# Patient Record
Sex: Male | Born: 1945 | Race: White | Hispanic: No | Marital: Married | Smoking: Former smoker
Health system: Southern US, Community
[De-identification: ages and names within clinical notes are randomized; demographics above are authoritative.]

## PROBLEM LIST (undated history)

## (undated) DIAGNOSIS — M1990 Unspecified osteoarthritis, unspecified site: Secondary | ICD-10-CM

## (undated) DIAGNOSIS — I10 Essential (primary) hypertension: Secondary | ICD-10-CM

## (undated) DIAGNOSIS — E119 Type 2 diabetes mellitus without complications: Secondary | ICD-10-CM

## (undated) HISTORY — PX: JOINT REPLACEMENT: SHX530

## (undated) HISTORY — PX: SMALL INTESTINE SURGERY: SHX150

## (undated) HISTORY — PX: CHOLECYSTECTOMY: SHX55

---

## 2008-12-28 DIAGNOSIS — E119 Type 2 diabetes mellitus without complications: Secondary | ICD-10-CM | POA: Insufficient documentation

## 2009-01-12 DIAGNOSIS — I10 Essential (primary) hypertension: Secondary | ICD-10-CM | POA: Insufficient documentation

## 2011-02-08 DIAGNOSIS — R591 Generalized enlarged lymph nodes: Secondary | ICD-10-CM | POA: Insufficient documentation

## 2013-06-20 DIAGNOSIS — R809 Proteinuria, unspecified: Secondary | ICD-10-CM | POA: Insufficient documentation

## 2014-06-23 DIAGNOSIS — Z2821 Immunization not carried out because of patient refusal: Secondary | ICD-10-CM | POA: Insufficient documentation

## 2014-06-23 DIAGNOSIS — Z789 Other specified health status: Secondary | ICD-10-CM | POA: Insufficient documentation

## 2015-08-27 ENCOUNTER — Encounter: Payer: Self-pay | Admitting: Sports Medicine

## 2015-08-27 ENCOUNTER — Ambulatory Visit (INDEPENDENT_AMBULATORY_CARE_PROVIDER_SITE_OTHER): Payer: Medicare Other | Admitting: Sports Medicine

## 2015-08-27 DIAGNOSIS — M79672 Pain in left foot: Secondary | ICD-10-CM | POA: Diagnosis not present

## 2015-08-27 DIAGNOSIS — B351 Tinea unguium: Secondary | ICD-10-CM

## 2015-08-27 DIAGNOSIS — M216X9 Other acquired deformities of unspecified foot: Secondary | ICD-10-CM

## 2015-08-27 DIAGNOSIS — M79671 Pain in right foot: Secondary | ICD-10-CM

## 2015-08-27 DIAGNOSIS — E1142 Type 2 diabetes mellitus with diabetic polyneuropathy: Secondary | ICD-10-CM | POA: Diagnosis not present

## 2015-08-27 DIAGNOSIS — L84 Corns and callosities: Secondary | ICD-10-CM | POA: Diagnosis not present

## 2015-08-27 NOTE — Patient Instructions (Addendum)
Diabetes and Foot Care Diabetes may cause you to have problems because of poor blood supply (circulation) to your feet and legs. This may cause the skin on your feet to become thinner, break easier, and heal more slowly. Your skin may become dry, and the skin may peel and crack. You may also have nerve damage in your legs and feet causing decreased feeling in them. You may not notice minor injuries to your feet that could lead to infections or more serious problems. Taking care of your feet is one of the most important things you can do for yourself.  HOME CARE INSTRUCTIONS  Wear shoes at all times, even in the house. Do not go barefoot. Bare feet are easily injured.  Check your feet daily for blisters, cuts, and redness. If you cannot see the bottom of your feet, use a mirror or ask someone for help.  Wash your feet with warm water (do not use hot water) and mild soap. Then pat your feet and the areas between your toes until they are completely dry. Do not soak your feet as this can dry your skin.  Apply a moisturizing lotion or petroleum jelly (that does not contain alcohol and is unscented) to the skin on your feet and to dry, brittle toenails. Do not apply lotion between your toes.  Trim your toenails straight across. Do not dig under them or around the cuticle. File the edges of your nails with an emery board or nail file.  Do not cut corns or calluses or try to remove them with medicine.  Wear clean socks or stockings every day. Make sure they are not too tight. Do not wear knee-high stockings since they may decrease blood flow to your legs.  Wear shoes that fit properly and have enough cushioning. To break in new shoes, wear them for just a few hours a day. This prevents you from injuring your feet. Always look in your shoes before you put them on to be sure there are no objects inside.  Do not cross your legs. This may decrease the blood flow to your feet.  If you find a minor scrape,  cut, or break in the skin on your feet, keep it and the skin around it clean and dry. These areas may be cleansed with mild soap and water. Do not cleanse the area with peroxide, alcohol, or iodine.  When you remove an adhesive bandage, be sure not to damage the skin around it.  If you have a wound, look at it several times a day to make sure it is healing.  Do not use heating pads or hot water bottles. They may burn your skin. If you have lost feeling in your feet or legs, you may not know it is happening until it is too late.  Make sure your health care provider performs a complete foot exam at least annually or more often if you have foot problems. Report any cuts, sores, or bruises to your health care provider immediately. SEEK MEDICAL CARE IF:   You have an injury that is not healing.  You have cuts or breaks in the skin.  You have an ingrown nail.  You notice redness on your legs or feet.  You feel burning or tingling in your legs or feet.  You have pain or cramps in your legs and feet.  Your legs or feet are numb.  Your feet always feel cold. SEEK IMMEDIATE MEDICAL CARE IF:   There is increasing redness,   swelling, or pain in or around a wound.  There is a red line that goes up your leg.  Pus is coming from a wound.  You develop a fever or as directed by your health care provider.  You notice a bad smell coming from an ulcer or wound.   This information is not intended to replace advice given to you by your health care provider. Make sure you discuss any questions you have with your health care provider.   Document Released: 05/05/2000 Document Revised: 01/08/2013 Document Reviewed: 10/15/2012 Elsevier Interactive Patient Education 2016 Elsevier Inc.   Okeeffe's healthy feet cream- can purchase from [facility], [facility], [facility]

## 2015-08-27 NOTE — Progress Notes (Addendum)
Patient ID: Lucas Griffin, male   DOB: [birth year], 70 y.o.   MRN: [id number] Subjective: Lucas Griffin is a 70 y.o. male patient with history of diabetes who presents to office today complaining of long, painful nails  while ambulating in shoes; unable to trim. Patient states that the glucose reading this morning was 125 mg/dl. Patient denies any new changes in medication or new problems. Patient denies any new cramping, numbness, burning or tingling in the legs. Admits to baseline numbness in R>L leg. No other issues.  Patient Active Problem List   Diagnosis Date Noted  . Influenza vaccination declined 06/23/2014  . Consent not given for pneumococcal immunization 06/23/2014  . Microalbuminuria 06/20/2013  . Adenopathy 02/08/2011  . Benign hypertension 01/12/2009  . Diabetes mellitus (HCC) 12/28/2008  . Hypercholesterolemia 12/28/2008   No current outpatient prescriptions on file prior to visit.   No current facility-administered medications on file prior to visit.   Not on File  No results found for this or any previous visit (from the past 2160 hour(s)).  Objective: General: Patient is awake, alert, and oriented x 3 and in no acute distress.  Integument: Skin is warm, dry and supple bilateral. Nails are tender, mildly elongated, thickened and  dystrophic with subungual debris, consistent with onychomycosis, 1-5 bilateral. No signs of infection. + Callus medial 1st MTPJ present bilateral with no signs of infection. Remaining integument unremarkable.  Vasculature:  Dorsalis Pedis pulse 2/4 bilateral. Posterior Tibial pulse  1/4 bilateral.  Capillary fill time <3 sec 1-5 bilateral. Scant hair growth to the level of the digits. Temperature gradient within normal limits. No varicosities present bilateral. No edema present bilateral.   Neurology: The patient has intact sensation measured with a 5.07/10g Semmes Weinstein Monofilament at all pedal sites bilateral . Vibratory sensation  diminished bilateral with tuning fork. No Babinski sign present bilateral.   Musculoskeletal: Hammertoe and pes cavus pedal deformities noted bilateral. Muscular strength 5/5 in all lower extremity muscular groups bilateral without pain on range of motion . No tenderness with calf compression bilateral.  Assessment and Plan: Problem List Items Addressed This Visit    None    Visit Diagnoses    Dermatophytosis of nail    -  Primary    Callus of foot        Foot pain, bilateral        Diabetic polyneuropathy associated with type 2 diabetes mellitus (HCC)        Relevant Medications    aspirin EC 81 MG tablet    atorvastatin (LIPITOR) 10 MG tablet    glyBURIDE (DIABETA) 2.5 MG tablet    lisinopril (PRINIVIL,ZESTRIL) 10 MG tablet    metFORMIN (GLUCOPHAGE) 500 MG tablet    metFORMIN (GLUCOPHAGE) 500 MG tablet    Pes cavus, unspecified laterality          -Examined patient. -Discussed and educated patient on diabetic foot care, especially with  regards to the vascular, neurological and musculoskeletal systems.  -Stressed the importance of good glycemic control and the detriment of not  controlling glucose levels in relation to the foot. -Mechanically parred callus x 2 and debrided all nails 1-5 bilateral using sterile nail nipper and filed with dremel without incident  -[employer] diabetic shoe order form was completed; office to contact primary care for approval / certification;  Office to arrange shoe fitting and dispensing. -Answered all patient questions -Patient to return in 3 months for at risk foot care -Patient advised to call the  office if any problems or questions arise in the meantime.  Asencion Islamitorya Jeniece Hannis, DPM

## 2015-11-26 ENCOUNTER — Encounter: Payer: Self-pay | Admitting: Sports Medicine

## 2015-11-26 ENCOUNTER — Ambulatory Visit (INDEPENDENT_AMBULATORY_CARE_PROVIDER_SITE_OTHER): Payer: Medicare Other | Admitting: Sports Medicine

## 2015-11-26 DIAGNOSIS — M79672 Pain in left foot: Secondary | ICD-10-CM

## 2015-11-26 DIAGNOSIS — L84 Corns and callosities: Secondary | ICD-10-CM | POA: Diagnosis not present

## 2015-11-26 DIAGNOSIS — B351 Tinea unguium: Secondary | ICD-10-CM | POA: Diagnosis not present

## 2015-11-26 DIAGNOSIS — E1142 Type 2 diabetes mellitus with diabetic polyneuropathy: Secondary | ICD-10-CM | POA: Diagnosis not present

## 2015-11-26 DIAGNOSIS — M79671 Pain in right foot: Secondary | ICD-10-CM

## 2015-11-26 MED ORDER — GABAPENTIN 300 MG PO CAPS: 300.0000 mg | ORAL_CAPSULE | Freq: Every day | ORAL | Status: DC

## 2015-11-26 NOTE — Progress Notes (Signed)
Patient ID: Lucas HollowJames E Marlar, male   DOB: Jul 21, 1945, 70 y.o.   MRN: [id number] Subjective: Lucas Griffin is a 70 y.o. male patient with history of diabetes who returns to office today complaining of callus and long, painful nails  while ambulating in shoes; unable to trim. Patient states that the glucose reading was "good". Patient denies any new changes in medication or new problems. Patient denies any new cramping, numbness, burning or tingling in the legs. Admits to baseline numbness in R>L leg that appears to be worsening. No other issues.  Patient Active Problem List   Diagnosis Date Noted  . Influenza vaccination declined 06/23/2014  . Consent not given for pneumococcal immunization 06/23/2014  . Microalbuminuria 06/20/2013  . Adenopathy 02/08/2011  . Benign hypertension 01/12/2009  . Diabetes mellitus (HCC) 12/28/2008  . Hypercholesterolemia 12/28/2008   Current Outpatient Prescriptions on File Prior to Visit  Medication Sig Dispense Refill  . aspirin EC 81 MG tablet Take 81 mg by mouth.    Marland Kitchen. atorvastatin (LIPITOR) 10 MG tablet Take 10 mg by mouth.    . Bilberry 500 MG CAPS Take 1,000 mg by mouth.    . Blood Glucose Monitoring Suppl (RA BLOOD GLUCOSE MONITOR) DEVI     . calcium carbonate (CALCIUM 600) 600 MG TABS tablet Take by mouth.    . glyBURIDE (DIABETA) 2.5 MG tablet Take 2.5 mg by mouth.    Marland Kitchen. lisinopril (PRINIVIL,ZESTRIL) 10 MG tablet     . metFORMIN (GLUCOPHAGE) 500 MG tablet     . metFORMIN (GLUCOPHAGE) 500 MG tablet     . Multiple Vitamin (MULTI-VITAMINS) TABS Take by mouth.    . Omega-3 Fatty Acids (FISH OIL) 1000 MG CAPS Take 1,200 mg by mouth.    . vitamin C (ASCORBIC ACID) 500 MG tablet Take 500 mg by mouth.     No current facility-administered medications on file prior to visit.   No Known Allergies  No results found for this or any previous visit (from the past 2160 hour(s)).  Objective: General: Patient is awake, alert, and oriented x 3 and in no acute  distress.  Integument: Skin is warm, dry and supple bilateral. Nails are tender, mildly elongated, thickened and  dystrophic with subungual debris, consistent with onychomycosis, 1-5 bilateral. No signs of infection. + Callus medial 1st MTPJ present bilateral with no signs of infection. Remaining integument unremarkable.  Vasculature:  Dorsalis Pedis pulse 2/4 bilateral. Posterior Tibial pulse  1/4 bilateral.  Capillary fill time <3 sec 1-5 bilateral. Scant hair growth to the level of the digits. Temperature gradient within normal limits. No varicosities present bilateral. No edema present bilateral.   Neurology: The patient has intact sensation measured with a 5.07/10g Semmes Weinstein Monofilament at all pedal sites bilateral . Vibratory sensation diminished bilateral with tuning fork. No Babinski sign present bilateral.   Musculoskeletal: Hammertoe and pes cavus pedal deformities noted bilateral. Muscular strength 5/5 in all lower extremity muscular groups bilateral without pain on range of motion . No tenderness with calf compression bilateral.  Assessment and Plan: Problem List Items Addressed This Visit    None    Visit Diagnoses    Dermatophytosis of nail    -  Primary    Callus of foot        Foot pain, bilateral        Diabetic polyneuropathy associated with type 2 diabetes mellitus (HCC)        Relevant Medications    gabapentin (NEURONTIN) 300 MG capsule      -  Examined patient. -Discussed and educated patient on diabetic foot care, especially with  regards to the vascular, neurological and musculoskeletal systems.  -Stressed the importance of good glycemic control and the detriment of not  controlling glucose levels in relation to the foot. -Mechanically parred callus x 2 and debrided all nails 1-5 bilateral using sterile nail nipper and filed with dremel without incident  -Rx Gabapentin 300 QHS for nerve pain -Awaiting Diabetic shoes -Answered all patient  questions -Patient to return in 3 months for at risk foot care -Patient advised to call the office if any problems or questions arise in the meantime.  Asencion Islamitorya Ohana Birdwell, DPM

## 2016-06-30 ENCOUNTER — Ambulatory Visit (INDEPENDENT_AMBULATORY_CARE_PROVIDER_SITE_OTHER): Payer: Medicare Other | Admitting: Podiatry

## 2016-06-30 ENCOUNTER — Encounter: Payer: Self-pay | Admitting: Podiatry

## 2016-06-30 ENCOUNTER — Ambulatory Visit (INDEPENDENT_AMBULATORY_CARE_PROVIDER_SITE_OTHER): Payer: Medicare Other

## 2016-06-30 DIAGNOSIS — E1143 Type 2 diabetes mellitus with diabetic autonomic (poly)neuropathy: Secondary | ICD-10-CM

## 2016-06-30 DIAGNOSIS — R52 Pain, unspecified: Secondary | ICD-10-CM

## 2016-07-15 NOTE — Progress Notes (Signed)
   Subjective:  71 year old male presents the office today for evaluation of standing pain to his right foot. 6 that comes and goes 47been going on for a few days now. Patient does have a history of diabetes mellitus. Patient denies trauma    Objective/Physical Exam General: The patient is alert and oriented x3 in no acute distress.  Dermatology: Dry, xerotic hyperkeratotic calluses and skin noted to the bilateral weightbearing surfaces of the feet. Skin is warm, dry and supple bilateral lower extremities. Negative for open lesions or macerations.  Vascular: Palpable pedal pulses bilaterally. No edema or erythema noted. Capillary refill within normal limits.  Neurological: Epicritic and protective threshold diminished bilaterally.   Musculoskeletal Exam: Range of motion within normal limits to all pedal and ankle joints bilateral. Muscle strength 5/5 in all groups bilateral.   Assessment: #1 diabetes mellitus with symptomatic peripheral neuropathy   Plan of Care:  #1 Patient was evaluated. #2 [facility] peripheral neuropathy cream [facility] #3 recommend AmLactin cream over-the-counter #4 return to clinic when necessary   Felecia ShellingBrent M. Evans, DPM [facility]  Dr. Felecia ShellingBrent M. Evans, DPM    [address]                                        [place], [place] [address]                Office [phone]  Fax [phone]

## 2016-10-06 ENCOUNTER — Ambulatory Visit (INDEPENDENT_AMBULATORY_CARE_PROVIDER_SITE_OTHER): Payer: Medicare Other | Admitting: Podiatry

## 2016-10-06 DIAGNOSIS — E0842 Diabetes mellitus due to underlying condition with diabetic polyneuropathy: Secondary | ICD-10-CM

## 2016-10-06 DIAGNOSIS — M79676 Pain in unspecified toe(s): Secondary | ICD-10-CM

## 2016-10-06 DIAGNOSIS — B351 Tinea unguium: Secondary | ICD-10-CM

## 2016-10-06 DIAGNOSIS — L84 Corns and callosities: Secondary | ICD-10-CM

## 2016-10-07 NOTE — Progress Notes (Signed)
   SUBJECTIVE Patient with a history of diabetes mellitus presents to office today complaining of elongated, thickened nails. Pain while ambulating in shoes. Patient is unable to trim their own nails. He also reports a callus on the right foot and is requesting treatment.   OBJECTIVE General Patient is awake, alert, and oriented x 3 and in no acute distress. Derm Skin is dry and supple bilateral. Negative open lesions or macerations. Remaining integument unremarkable. Nails are tender, long, thickened and dystrophic with subungual debris, consistent with onychomycosis, 1-5 bilateral. No signs of infection noted. Hyperkeratotic lesion x 1 present on the right foot. Pain on palpation with a central nucleated core noted.   Vasc  DP and PT pedal pulses palpable bilaterally. Temperature gradient within normal limits.  Neuro Epicritic and protective threshold sensation diminished bilaterally.  Musculoskeletal Exam No symptomatic pedal deformities noted bilateral. Muscular strength within normal limits.  ASSESSMENT 1. Diabetes Mellitus w/ peripheral neuropathy 2. Onychomycosis of nail due to dermatophyte bilateral 3. Pain in foot bilateral 4. Porokeratotic callus lesions plantar right foot x 1  PLAN OF CARE 1. Patient evaluated today. 2. Instructed to maintain good pedal hygiene and foot care. Stressed importance of controlling blood sugar.  3. Mechanical debridement of nails 1-5 bilaterally performed using a nail nipper. Filed with dremel without incident.  4. Excisional debridement of keratotic lesion using a chisel blade was performed without incident.  5. Treated area(s) with Salinocaine and dressed with light dressing. 6. Return to clinic in 3 mos.     Felecia ShellingBrent M. Harjas Biggins, DPM [facility]  Dr. Felecia ShellingBrent M. Patric Vanpelt, DPM    [address]                                        [place], [place] [address]                Office [phone]  Fax [phone]

## 2018-12-17 ENCOUNTER — Ambulatory Visit: Payer: Self-pay | Admitting: Orthopedic Surgery

## 2019-01-08 ENCOUNTER — Other Ambulatory Visit: Payer: Self-pay

## 2019-01-10 ENCOUNTER — Other Ambulatory Visit: Payer: Medicare Other

## 2019-01-10 ENCOUNTER — Other Ambulatory Visit
Admission: RE | Admit: 2019-01-10 | Discharge: 2019-01-10 | Disposition: A | Discharge status: Home / Self Care | Payer: Medicare Other | Source: Ambulatory Visit | Attending: Orthopedic Surgery | Admitting: Orthopedic Surgery

## 2019-01-10 ENCOUNTER — Other Ambulatory Visit: Payer: Self-pay

## 2019-01-10 DIAGNOSIS — I10 Essential (primary) hypertension: Secondary | ICD-10-CM | POA: Diagnosis not present

## 2019-01-10 DIAGNOSIS — E119 Type 2 diabetes mellitus without complications: Secondary | ICD-10-CM | POA: Insufficient documentation

## 2019-01-10 DIAGNOSIS — Z20828 Contact with and (suspected) exposure to other viral communicable diseases: Secondary | ICD-10-CM | POA: Insufficient documentation

## 2019-01-10 DIAGNOSIS — Z01812 Encounter for preprocedural laboratory examination: Secondary | ICD-10-CM | POA: Insufficient documentation

## 2019-01-10 HISTORY — DX: Essential (primary) hypertension: I10

## 2019-01-10 HISTORY — DX: Type 2 diabetes mellitus without complications: E11.9

## 2019-01-10 LAB — APTT: aPTT: 30 seconds (ref 24–36)

## 2019-01-10 LAB — PROTIME-INR
INR: 1 (ref 0.8–1.2)
Prothrombin Time: 12.7 seconds (ref 11.4–15.2)

## 2019-01-10 LAB — CBC
HCT: 43.4 % (ref 39.0–52.0)
Hemoglobin: 14.5 g/dL (ref 13.0–17.0)
MCH: 29.9 pg (ref 26.0–34.0)
MCHC: 33.4 g/dL (ref 30.0–36.0)
MCV: 89.5 fL (ref 80.0–100.0)
Platelets: 283 10*3/uL (ref 150–400)
RBC: 4.85 MIL/uL (ref 4.22–5.81)
RDW: 14.1 % (ref 11.5–15.5)
WBC: 6.9 10*3/uL (ref 4.0–10.5)
nRBC: 0 % (ref 0.0–0.2)

## 2019-01-10 LAB — BASIC METABOLIC PANEL
Anion gap: 12 (ref 5–15)
BUN: 18 mg/dL (ref 8–23)
CO2: 22 mmol/L (ref 22–32)
Calcium: 9.3 mg/dL (ref 8.9–10.3)
Chloride: 105 mmol/L (ref 98–111)
Creatinine, Ser: 0.61 mg/dL (ref 0.61–1.24)
GFR calc Af Amer: >60 mL/min (ref >60)
GFR calc non Af Amer: >60 mL/min (ref >60)
Glucose, Bld: 88 mg/dL (ref 70–99)
Potassium: 3.8 mmol/L (ref 3.5–5.1)
Sodium: 139 mmol/L (ref 135–145)

## 2019-01-10 NOTE — Patient Instructions (Signed)
  Your procedure is scheduled on: Wednesday January 15, 2019 Report to Same Day Surgery 2nd floor [facility] Entrance-take elevator on left to 2nd floor.  Check in with surgery information desk.) To find out your arrival time, call [phone] 1:00-3:00 PM on Tuesday January 14, 2019  Remember: Instructions that are not followed completely may result in serious medical risk, up to and including death, or upon the discretion of your surgeon and anesthesiologist your surgery may need to be rescheduled.    __x__ 1. Do not eat food (including mints, candies, chewing gum) after midnight the night before your procedure. You may drink water up to 2 hours before you are scheduled to arrive at the hospital for your procedure.  Do not drink anything within 2 hours of your scheduled arrival to the hospital.    __x__ 2. No Alcohol for 24 hours before or after surgery.   __x__ 3. No Smoking or e-cigarettes for 24 hours before surgery.  Do not use any chewable tobacco products for at least 6 hours before surgery.   __x__ 4. Notify your doctor if there is any change in your medical condition (cold, fever, infections).   __x__ 5. On the morning of surgery brush your teeth with toothpaste and water.  You may rinse your mouth with mouthwash if you wish.  Do not swallow any toothpaste or mouthwash.  Please read over the following fact sheets that you were given:   [facility] Preparing for Surgery and/or MRSA Information    __x__ Use CHG Soap as directed on instruction sheet.   Do not wear jewelry on the day of surgery.  Do not wear lotions, powders, deodorant, or perfumes.   Do not shave below the face/neck 48 hours prior to surgery.   Do not bring valuables to the hospital.    [facility] is not responsible for any belongings or valuables.               Contacts, dentures or bridgework may not be worn into surgery.  For patients discharged on the day of surgery, you will NOT be permitted  to drive yourself home.  You must have a responsible adult with you for 24 hours after surgery.  __x__ Take these medicines on the morning of surgery with a SMALL SIP OF WATER:  1. NONE  __x__ Stop Metformin 2 days before surgery (Last dose Sunday January 12, 2019).    ____ Follow recommendations from Cardiologist, Pulmonologist or PCP regarding stopping Aspirin, Coumadin, Plavix, Eliquis, Effient, Pradaxa, and Pletal.  __x__ STARTING TODAY: Stop Anti-inflammatories such as ASPIRIN, Advil, Ibuprofen, Motrin, Aleve, Naproxen, Naprosyn, BC/Goodies powders or aspirin products. You may continue to take Tylenol and Celebrex.   __x__ STARTING TODAY: Stop over the counter supplements (Vitamin C, Cod Liver Oil, Fish Oil) until after surgery. You may continue to take your multivitamin and iron supplement.

## 2019-01-11 LAB — SARS CORONAVIRUS 2 (TAT 6-24 HRS): SARS Coronavirus 2: NEGATIVE

## 2019-01-14 MED ORDER — CEFAZOLIN SODIUM-DEXTROSE 2-4 GM/100ML-% IV SOLN
2.0000 g | INTRAVENOUS | Status: AC
  Administered 2019-01-15: 14:00:00 2 g via INTRAVENOUS

## 2019-01-14 MED ORDER — TRANEXAMIC ACID-NACL 1000-0.7 MG/100ML-% IV SOLN
1000.0000 mg | INTRAVENOUS | Status: AC
  Administered 2019-01-15: 10 mg via INTRAVENOUS
  Filled 2019-01-14: qty 100

## 2019-01-15 ENCOUNTER — Ambulatory Visit
Admission: RE | Admit: 2019-01-15 | Discharge: 2019-01-15 | Disposition: A | Discharge status: Home / Self Care | Payer: Medicare Other | Attending: Orthopedic Surgery | Admitting: Orthopedic Surgery

## 2019-01-15 ENCOUNTER — Other Ambulatory Visit: Payer: Self-pay

## 2019-01-15 ENCOUNTER — Ambulatory Visit: Payer: Medicare Other | Admitting: Anesthesiology

## 2019-01-15 ENCOUNTER — Encounter: Payer: Self-pay | Admitting: Anesthesiology

## 2019-01-15 ENCOUNTER — Encounter
Admission: RE | Disposition: A | Discharge status: Home / Self Care | Payer: Self-pay | Source: Home / Self Care | Attending: Orthopedic Surgery

## 2019-01-15 ENCOUNTER — Ambulatory Visit: Admit: 2019-01-15 | Payer: Self-pay | Admitting: Orthopedic Surgery

## 2019-01-15 DIAGNOSIS — Z96659 Presence of unspecified artificial knee joint: Secondary | ICD-10-CM | POA: Insufficient documentation

## 2019-01-15 DIAGNOSIS — E119 Type 2 diabetes mellitus without complications: Secondary | ICD-10-CM | POA: Diagnosis not present

## 2019-01-15 DIAGNOSIS — Z7984 Long term (current) use of oral hypoglycemic drugs: Secondary | ICD-10-CM | POA: Insufficient documentation

## 2019-01-15 DIAGNOSIS — Z8249 Family history of ischemic heart disease and other diseases of the circulatory system: Secondary | ICD-10-CM | POA: Insufficient documentation

## 2019-01-15 DIAGNOSIS — M109 Gout, unspecified: Secondary | ICD-10-CM | POA: Insufficient documentation

## 2019-01-15 DIAGNOSIS — I10 Essential (primary) hypertension: Secondary | ICD-10-CM | POA: Diagnosis not present

## 2019-01-15 DIAGNOSIS — Z87891 Personal history of nicotine dependence: Secondary | ICD-10-CM | POA: Insufficient documentation

## 2019-01-15 DIAGNOSIS — Z833 Family history of diabetes mellitus: Secondary | ICD-10-CM | POA: Insufficient documentation

## 2019-01-15 DIAGNOSIS — Z79899 Other long term (current) drug therapy: Secondary | ICD-10-CM | POA: Diagnosis not present

## 2019-01-15 DIAGNOSIS — M19011 Primary osteoarthritis, right shoulder: Secondary | ICD-10-CM | POA: Diagnosis not present

## 2019-01-15 DIAGNOSIS — M1990 Unspecified osteoarthritis, unspecified site: Secondary | ICD-10-CM | POA: Diagnosis not present

## 2019-01-15 DIAGNOSIS — M75121 Complete rotator cuff tear or rupture of right shoulder, not specified as traumatic: Secondary | ICD-10-CM | POA: Insufficient documentation

## 2019-01-15 HISTORY — PX: SHOULDER ARTHROSCOPY WITH ROTATOR CUFF REPAIR: SHX5685

## 2019-01-15 LAB — GLUCOSE, CAPILLARY
Glucose-Capillary: 208 mg/dL — ABNORMAL HIGH (ref 70–99)
Glucose-Capillary: 208 mg/dL — ABNORMAL HIGH (ref 70–99)

## 2019-01-15 SURGERY — ARTHROSCOPY, SHOULDER, WITH ROTATOR CUFF REPAIR
Anesthesia: General | Site: Shoulder | Laterality: Right

## 2019-01-15 SURGERY — ARTHROSCOPY, SHOULDER, WITH ROTATOR CUFF REPAIR
Anesthesia: Choice | Site: Shoulder | Laterality: Right

## 2019-01-15 MED ORDER — SODIUM CHLORIDE FLUSH 0.9 % IV SOLN
INTRAVENOUS | Status: AC
  Filled 2019-01-15: qty 40

## 2019-01-15 MED ORDER — FAMOTIDINE 20 MG PO TABS
20.0000 mg | ORAL_TABLET | Freq: Once | ORAL | Status: AC
  Administered 2019-01-15: 20 mg via ORAL

## 2019-01-15 MED ORDER — ROPIVACAINE HCL 5 MG/ML IJ SOLN
INTRAMUSCULAR | Status: AC
  Filled 2019-01-15: qty 20

## 2019-01-15 MED ORDER — ONDANSETRON HCL 4 MG/2ML IJ SOLN
INTRAMUSCULAR | Status: DC | PRN
  Administered 2019-01-15 (×2): 4 mg via INTRAVENOUS

## 2019-01-15 MED ORDER — ONDANSETRON HCL 4 MG/2ML IJ SOLN: 4.0000 mg | Freq: Once | INTRAMUSCULAR | Status: DC | PRN

## 2019-01-15 MED ORDER — SUCCINYLCHOLINE CHLORIDE 20 MG/ML IJ SOLN
INTRAMUSCULAR | Status: DC | PRN
  Administered 2019-01-15: 140 mg via INTRAVENOUS

## 2019-01-15 MED ORDER — PROPOFOL 10 MG/ML IV BOLUS
INTRAVENOUS | Status: DC | PRN
  Administered 2019-01-15: 200 mg via INTRAVENOUS

## 2019-01-15 MED ORDER — CEFAZOLIN SODIUM-DEXTROSE 2-4 GM/100ML-% IV SOLN
INTRAVENOUS | Status: AC
  Filled 2019-01-15: qty 100

## 2019-01-15 MED ORDER — CHLORHEXIDINE GLUCONATE 4 % EX LIQD
60.0000 mL | Freq: Once | CUTANEOUS | Status: AC
  Administered 2019-01-15: 4 via TOPICAL

## 2019-01-15 MED ORDER — DOCUSATE SODIUM 100 MG PO CAPS: 100.0000 mg | ORAL_CAPSULE | Freq: Every day | ORAL | 2 refills | Status: DC | PRN

## 2019-01-15 MED ORDER — ROCURONIUM BROMIDE 100 MG/10ML IV SOLN
INTRAVENOUS | Status: DC | PRN
  Administered 2019-01-15: 10 mg via INTRAVENOUS
  Administered 2019-01-15: 20 mg via INTRAVENOUS
  Administered 2019-01-15: 40 mg via INTRAVENOUS

## 2019-01-15 MED ORDER — SODIUM CHLORIDE 0.9 % IV SOLN
INTRAVENOUS | Status: DC
  Administered 2019-01-15 (×2): via INTRAVENOUS

## 2019-01-15 MED ORDER — LIDOCAINE HCL (PF) 1 % IJ SOLN
INTRAMUSCULAR | Status: AC
  Filled 2019-01-15: qty 5

## 2019-01-15 MED ORDER — HYDROCODONE-ACETAMINOPHEN 5-325 MG PO TABS
1.0000 | ORAL_TABLET | Freq: Once | ORAL | Status: AC
  Administered 2019-01-15: 17:00:00 1 via ORAL

## 2019-01-15 MED ORDER — CELECOXIB 200 MG PO CAPS
400.0000 mg | ORAL_CAPSULE | Freq: Once | ORAL | Status: AC
  Administered 2019-01-15: 11:00:00 400 mg via ORAL

## 2019-01-15 MED ORDER — MIDAZOLAM HCL 2 MG/2ML IJ SOLN
2.0000 mg | Freq: Once | INTRAMUSCULAR | Status: AC
  Administered 2019-01-15: 13:00:00 2 mg via INTRAVENOUS

## 2019-01-15 MED ORDER — HYDROCODONE-ACETAMINOPHEN 5-325 MG PO TABS: 1.0000 | ORAL_TABLET | ORAL | 0 refills | Status: DC | PRN

## 2019-01-15 MED ORDER — MIDAZOLAM HCL 2 MG/2ML IJ SOLN
INTRAMUSCULAR | Status: AC
  Administered 2019-01-15: 2 mg via INTRAVENOUS
  Filled 2019-01-15: qty 2

## 2019-01-15 MED ORDER — FENTANYL CITRATE (PF) 100 MCG/2ML IJ SOLN
25.0000 ug | INTRAMUSCULAR | Status: DC | PRN
  Administered 2019-01-15 (×4): 25 ug via INTRAVENOUS

## 2019-01-15 MED ORDER — EPINEPHRINE 30 MG/30ML IJ SOLN
INTRAMUSCULAR | Status: DC | PRN
  Administered 2019-01-15: 4 mL

## 2019-01-15 MED ORDER — ACETAMINOPHEN 500 MG PO TABS
1000.0000 mg | ORAL_TABLET | Freq: Once | ORAL | Status: AC
  Administered 2019-01-15: 1000 mg via ORAL

## 2019-01-15 MED ORDER — HYDROCODONE-ACETAMINOPHEN 5-325 MG PO TABS
ORAL_TABLET | ORAL | Status: AC
  Administered 2019-01-15: 17:00:00 1 via ORAL
  Filled 2019-01-15: qty 1

## 2019-01-15 MED ORDER — FENTANYL CITRATE (PF) 100 MCG/2ML IJ SOLN
INTRAMUSCULAR | Status: AC
  Administered 2019-01-15: 50 ug via INTRAVENOUS
  Filled 2019-01-15: qty 2

## 2019-01-15 MED ORDER — SODIUM CHLORIDE 0.9 % IV SOLN
INTRAVENOUS | Status: DC | PRN
  Administered 2019-01-15: 14:00:00 30 ug/min via INTRAVENOUS

## 2019-01-15 MED ORDER — FENTANYL CITRATE (PF) 100 MCG/2ML IJ SOLN
INTRAMUSCULAR | Status: AC
  Filled 2019-01-15: qty 2

## 2019-01-15 MED ORDER — FENTANYL CITRATE (PF) 100 MCG/2ML IJ SOLN
INTRAMUSCULAR | Status: DC | PRN
  Administered 2019-01-15 (×2): 25 ug via INTRAVENOUS
  Administered 2019-01-15: 50 ug via INTRAVENOUS

## 2019-01-15 MED ORDER — FAMOTIDINE 20 MG PO TABS
ORAL_TABLET | ORAL | Status: AC
  Filled 2019-01-15: qty 1

## 2019-01-15 MED ORDER — BUPIVACAINE HCL (PF) 0.5 % IJ SOLN
INTRAMUSCULAR | Status: AC
  Filled 2019-01-15: qty 10

## 2019-01-15 MED ORDER — PROPOFOL 10 MG/ML IV BOLUS
INTRAVENOUS | Status: AC
  Filled 2019-01-15: qty 20

## 2019-01-15 MED ORDER — BUPIVACAINE LIPOSOME 1.3 % IJ SUSP
INTRAMUSCULAR | Status: AC
  Filled 2019-01-15: qty 20

## 2019-01-15 MED ORDER — ACETAMINOPHEN 500 MG PO TABS
ORAL_TABLET | ORAL | Status: AC
  Filled 2019-01-15: qty 2

## 2019-01-15 MED ORDER — EPINEPHRINE PF 1 MG/ML IJ SOLN
INTRAMUSCULAR | Status: AC
  Filled 2019-01-15: qty 1

## 2019-01-15 MED ORDER — LIDOCAINE HCL (CARDIAC) PF 100 MG/5ML IV SOSY
PREFILLED_SYRINGE | INTRAVENOUS | Status: DC | PRN
  Administered 2019-01-15: 100 mg via INTRAVENOUS

## 2019-01-15 MED ORDER — BUPIVACAINE-EPINEPHRINE (PF) 0.25% -1:200000 IJ SOLN
INTRAMUSCULAR | Status: AC
  Filled 2019-01-15: qty 30

## 2019-01-15 MED ORDER — MIDAZOLAM HCL 2 MG/2ML IJ SOLN
INTRAMUSCULAR | Status: DC | PRN
  Administered 2019-01-15: 2 mg via INTRAVENOUS

## 2019-01-15 MED ORDER — MIDAZOLAM HCL 2 MG/2ML IJ SOLN
INTRAMUSCULAR | Status: AC
  Filled 2019-01-15: qty 2

## 2019-01-15 MED ORDER — FENTANYL CITRATE (PF) 100 MCG/2ML IJ SOLN
50.0000 ug | Freq: Once | INTRAMUSCULAR | Status: AC
  Administered 2019-01-15: 13:00:00 50 ug via INTRAVENOUS

## 2019-01-15 MED ORDER — DEXAMETHASONE SODIUM PHOSPHATE 10 MG/ML IJ SOLN
INTRAMUSCULAR | Status: DC | PRN
  Administered 2019-01-15: 10 mg via INTRAVENOUS

## 2019-01-15 MED ORDER — CELECOXIB 200 MG PO CAPS
ORAL_CAPSULE | ORAL | Status: AC
  Filled 2019-01-15: qty 2

## 2019-01-15 SURGICAL SUPPLY — 66 items
ADAPTER IRRIG TUBE 2 SPIKE SOL (ADAPTER) ×6 IMPLANT
ANCHOR SUT CROSSFT 4.75 (Anchor) ×6 IMPLANT
ANCHOR YKNOT PRO RC BLUE TAPE (Anchor) ×3 IMPLANT
ANCHOR YKNOT PRO RC HI-FI TAPE (Anchor) ×3 IMPLANT
BLADE FULL RADIUS 3.5 (BLADE) ×3 IMPLANT
BLADE INCISOR PLUS 4.5 (BLADE) ×3 IMPLANT
BLADE SURG MINI STRL (BLADE) ×3 IMPLANT
BRUSH SCRUB EZ  4% CHG (MISCELLANEOUS) ×2
BRUSH SCRUB EZ 4% CHG (MISCELLANEOUS) ×1 IMPLANT
BUR BR 5.5 WIDE MOUTH (BURR) ×3 IMPLANT
CANNULA SHOULDER 7CM (CANNULA) ×3 IMPLANT
CANNULA TWIST IN 8.25X7CM (CANNULA) ×3 IMPLANT
CHLORAPREP W/TINT 26 (MISCELLANEOUS) ×3 IMPLANT
COOLER POLAR GLACIER W/PUMP (MISCELLANEOUS) ×3 IMPLANT
COVER WAND RF STERILE (DRAPES) ×3 IMPLANT
CRADLE LAMINECT ARM (MISCELLANEOUS) ×3 IMPLANT
DEVICE SUCT BLK HOLE OR FLOOR (MISCELLANEOUS) ×3 IMPLANT
DRAPE 3/4 80X56 (DRAPES) ×3 IMPLANT
DRAPE SPLIT 6X30 W/TAPE (DRAPES) ×6 IMPLANT
DRAPE STERI 35X30 U-POUCH (DRAPES) ×3 IMPLANT
DRAPE U-SHAPE 47X51 STRL (DRAPES) ×12 IMPLANT
ELECT REM PT RETURN 9FT ADLT (ELECTROSURGICAL)
ELECTRODE REM PT RTRN 9FT ADLT (ELECTROSURGICAL) IMPLANT
GAUZE 4X4 16PLY RFD (DISPOSABLE) IMPLANT
GAUZE SPONGE 4X4 12PLY STRL (GAUZE/BANDAGES/DRESSINGS) ×3 IMPLANT
GAUZE XEROFORM 1X8 LF (GAUZE/BANDAGES/DRESSINGS) ×3 IMPLANT
GLOVE BIOGEL PI IND STRL 8 (GLOVE) ×1 IMPLANT
GLOVE BIOGEL PI INDICATOR 8 (GLOVE) ×2
GLOVE SURG ORTHO 8.0 STRL STRW (GLOVE) ×3 IMPLANT
GOWN STRL REUS W/ TWL LRG LVL3 (GOWN DISPOSABLE) ×1 IMPLANT
GOWN STRL REUS W/ TWL XL LVL3 (GOWN DISPOSABLE) ×1 IMPLANT
GOWN STRL REUS W/TWL LRG LVL3 (GOWN DISPOSABLE) ×2
GOWN STRL REUS W/TWL XL LVL3 (GOWN DISPOSABLE) ×2
IV LACTATED RINGER IRRG 3000ML (IV SOLUTION) ×16
IV LR IRRIG 3000ML ARTHROMATIC (IV SOLUTION) ×8 IMPLANT
KIT STABILIZATION SHOULDER (MISCELLANEOUS) ×3 IMPLANT
KIT TURNOVER KIT A (KITS) ×3 IMPLANT
MANIFOLD NEPTUNE II (INSTRUMENTS) ×6 IMPLANT
MASK FACE SPIDER DISP (MASK) ×3 IMPLANT
MAT ABSORB  FLUID 56X50 GRAY (MISCELLANEOUS) ×2
MAT ABSORB FLUID 56X50 GRAY (MISCELLANEOUS) ×1 IMPLANT
NDL SAFETY ECLIPSE 18X1.5 (NEEDLE) ×1 IMPLANT
NEEDLE HYPO 18GX1.5 SHARP (NEEDLE) ×2
NEEDLE HYPO 22GX1.5 SAFETY (NEEDLE) ×3 IMPLANT
NEEDLE SCORPION MULTI FIRE (NEEDLE) ×3 IMPLANT
NEEDLE SPNL 18GX3.5 QUINCKE PK (NEEDLE) ×3 IMPLANT
PACK ARTHROSCOPY SHOULDER (MISCELLANEOUS) ×3 IMPLANT
PAD ABD DERMACEA PRESS 5X9 (GAUZE/BANDAGES/DRESSINGS) ×3 IMPLANT
PAD WRAPON POLAR SHDR XLG (MISCELLANEOUS) ×1 IMPLANT
SLING ARM LRG DEEP (SOFTGOODS) ×3 IMPLANT
SLING ULTRA II LG (MISCELLANEOUS) ×3 IMPLANT
STRAP SAFETY 5IN WIDE (MISCELLANEOUS) ×3 IMPLANT
SUT ETHILON NAB PS2 4-0 18IN (SUTURE) ×3 IMPLANT
SUT FIBERWIRE #2 38 T-5 BLUE (SUTURE)
SUT PDS AB 0 CT1 27 (SUTURE) ×3 IMPLANT
SUT PROLENE 0 CT 2 (SUTURE) ×3 IMPLANT
SUT TIGER TAPE 7 IN WHITE (SUTURE) ×3 IMPLANT
SUTURE FIBERWR #2 38 T-5 BLUE (SUTURE) IMPLANT
SYR 10ML LL (SYRINGE) ×3 IMPLANT
SYR 50ML LL SCALE MARK (SYRINGE) ×3 IMPLANT
TAPE MICROFOAM 4IN (TAPE) ×3 IMPLANT
TUBING ARTHRO INFLOW-ONLY STRL (TUBING) ×3 IMPLANT
TUBING CONNECTING 10 (TUBING) ×2 IMPLANT
TUBING CONNECTING 10' (TUBING) ×1
WAND WEREWOLF FLOW 90D (MISCELLANEOUS) ×3 IMPLANT
WRAPON POLAR PAD SHDR XLG (MISCELLANEOUS) ×3

## 2019-01-15 NOTE — Anesthesia Procedure Notes (Signed)

## 2019-01-15 NOTE — Anesthesia Procedure Notes (Signed)
Anesthesia Regional Block: Interscalene brachial plexus block   Pre-Anesthetic Checklist: ,, timeout performed, Correct Patient, Correct Site, Correct Laterality, Correct Procedure, Correct Position, site marked, Risks and benefits discussed,  Surgical consent,  Pre-op evaluation,  At surgeon's request and post-op pain management  Laterality: Right  Prep: chloraprep, alcohol swabs       Needles:  Injection technique: Single-shot  Needle Type: Stimiplex     Needle Length: 5cm  Needle Gauge: 22     Additional Needles:   Procedures:, nerve stimulator,,, ultrasound used (permanent image in chart),,,,   Nerve Stimulator or Paresthesia:  Response: biceps flexion, 0.6 mA,   Additional Responses:   Narrative:  Injection made incrementally with aspirations every 5 mL.  Performed by: Personally   Additional Notes: Functioning IV was confirmed and monitors were applied.  A 70mm 22ga Stimuplex needle was used. Sterile prep and drape,hand hygiene and sterile gloves were used.  Negative aspiration and negative test dose prior to incremental administration of local anesthetic. The patient tolerated the procedure well.

## 2019-01-15 NOTE — Transfer of Care (Signed)
Immediate Anesthesia Transfer of Care Note  Patient: Lucas Griffin  Procedure(s) Performed: SHOULDER ARTHROSCOPY WITH ROTATOR CUFF REPAIR (Right Shoulder)  Patient Location: PACU  Anesthesia Type:General  Level of Consciousness: awake and sedated  Airway & Oxygen Therapy: Patient Spontanous Breathing and Patient connected to face mask oxygen  Post-op Assessment: Report given to RN and Post -op Vital signs reviewed and stable  Post vital signs: Reviewed and stable  Last Vitals:  Vitals Value Taken Time  BP 127/94 01/15/19 1602  Temp    Pulse 70 01/15/19 1603  Resp 18 01/15/19 1603  SpO2 98 % 01/15/19 1603  Vitals shown include unvalidated device data.  Last Pain:  Vitals:   01/15/19 1059  TempSrc: Temporal  PainSc: 1       Patients Stated Pain Goal: 0 (79/48/01 6553)  Complications: No apparent anesthesia complications

## 2019-01-15 NOTE — Op Note (Addendum)
01/15/2019  3:48 PM  PATIENT:  Lucas Griffin  73 y.o. 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 male with a diagnosis of M75.121 complete rotator cuff tear or rupture of right shoulder M13.811 other specified arthritis right shoulder who failed conservative measures and elected for surgical management.    The risks benefits and alternatives were discussed with the patient preoperatively including but not limited to the risks of infection, bleeding, nerve injury, persistent pain or weakness, failure of the hardware, re-tear of the rotator cuff and the need for further surgery. Medical risks include DVT and pulmonary embolism, myocardial infarction, stroke, pneumonia, respiratory failure and death. Patient understood these risks and wished to proceed.  OPERATIVE IMPLANTS: [employer] SpeedBridge System  OPERATIVE PROCEDURE: The patient was met in the preoperative area. The right shoulder was signed with my initials according the hospital's correct site of surgery protocol. The patient is brought to the OR and underwent a supraclavicular block and general endotracheal intubation by the anesthesia service.  The patient was placed in a beachchair position. A spider arm positioner was used for this case. Examination under anesthesia revealed negative sulcus without  anterior or posterior instability.  The patient was prepped and draped in a sterile fashion. A timeout was performed to verify the patient's name, date of birth, medical record number, correct site of surgery and correct procedure to be performed there was also used to verify the patient received antibiotics that all appropriate instruments, implants and radiographs studies were available in the room. Once all in attendance were in agreement case began.  Bony landmarks were drawn out with a surgical marker along with proposed arthroscopy incisions. These were pre-injected with 1% lidocaine plain. An 11 blade was used to establish a posterior portal through which the arthroscope was placed in the glenohumeral joint. A full diagnostic examination of the shoulder was performed. The anterior portal was established under direct visualization with an 18-gauge spinal needle.  A 5.75 mm arthroscopic cannula was placed through the anterior portal.   The intra-articular portion of the biceps tendon was found to have a partial tear involving greater than 50% of the diameter. Therefore the decision was made to perform a tenotomy. An arthroscopic wand was used to release the biceps tendon off the superior labrum. The arthroscopic shaver was then used to debride the frayed edges of the labrum. There were no anterior or superior labral tears seen.  Subscapularis tendon had a small proximal tear that was debrided. Patient had a full-thickness tear involving the supraspinatus with retraction. There were no loose bodies within the inferior recess and no evidence of HAGL lesion.  The arthroscope was then placed in the subacromial space. A lateral portal was then established using an 18-gauge spinal needle for localization.   The greater tuberosity was debrided using a 5.5 mm resector shaver blade to remove all remaining foreign fibers of the rotator cuff.  Debridement was performed until  punctate bleeding was seen at the  greater tuberosity footprint, which will allow for rotator cuff healing.  Extensive bursitis was encountered and debrided using a 4-0 resector shaver blade and a 90 ArthroCare wand from the lateral portal. Using the SpeedBridge system medial anchors with fiber tape were placed. The cuff was mobilized and the tape passed through the rotator cuff. The tape was then crossed in usual fashion and fixated on the lateral side with two SwiveLock anchors. The final construct was stable and moved as a unit with excellent coverage of the humeral head.  Using the burr, a distal clavicle excision was performed removing 8 mm of distal bone. A limited subacromial decompression was performed with removal of anterior spurs.   All incisions were copiously irrigated. Skin closure for the arthroscopic incisions was performed with 4-0 nylon.  A dry sterile dressing including Steri-Strips was applied . The patient was placed in an abduction sling.  All sharp and instrument counts were correct at the conclusion of the case. I was scrubbed and present for the entire case. I spoke with the patient's family in the post-op consultation room and informed them that the case had been performed without complication and the patient was stable in recovery room.   Kurtis Bushman, MD

## 2019-01-15 NOTE — Anesthesia Post-op Follow-up Note (Signed)
Anesthesia QCDR form completed.        

## 2019-01-15 NOTE — Anesthesia Preprocedure Evaluation (Addendum)
Anesthesia Evaluation  Patient identified by MRN, date of birth, ID band Patient awake    Reviewed: Allergy & Precautions, NPO status , Patient's Chart, lab work & pertinent test results  Airway Mallampati: III  TM Distance: <3 FB     Dental  (+) Caps   Pulmonary former smoker,    Pulmonary exam normal        Cardiovascular hypertension, Normal cardiovascular exam     Neuro/Psych negative neurological ROS  negative psych ROS   GI/Hepatic negative GI ROS, Neg liver ROS,   Endo/Other  diabetes  Renal/GU negative Renal ROS  negative genitourinary   Musculoskeletal  (+) Arthritis , Osteoarthritis,    Abdominal Normal abdominal exam  (+)   Peds negative pediatric ROS (+)  Hematology negative hematology ROS (+)   Anesthesia Other Findings   Reproductive/Obstetrics                            Anesthesia Physical Anesthesia Plan  ASA: II  Anesthesia Plan: General   Post-op Pain Management:  Regional for Post-op pain   Induction: Intravenous  PONV Risk Score and Plan:   Airway Management Planned: Oral ETT  Additional Equipment:   Intra-op Plan:   Post-operative Plan: Extubation in OR  Informed Consent: I have reviewed the patients History and Physical, chart, labs and discussed the procedure including the risks, benefits and alternatives for the proposed anesthesia with the patient or authorized representative who has indicated his/her understanding and acceptance.     Dental advisory given  Plan Discussed with: CRNA and Surgeon  Anesthesia Plan Comments:        Anesthesia Quick Evaluation

## 2019-01-15 NOTE — H&P (Signed)
The patient has been re-examined, and the chart reviewed, and there have been no interval changes to the documented history and physical.  Plan a right shoulder arthroscopic rotator cuff repair today.  Anesthesia is consulted regarding a peripheral nerve block for post-operative pain.  The risks, benefits, and alternatives have been discussed at length, and the patient is willing to proceed.     

## 2019-01-15 NOTE — Discharge Instructions (Addendum)
Wear sling at all times, including sleep.  You will need to use the sling for a total of 4 weeks following surgery.  Do not try and lift your arm up or away from your body for any reason.   Keep the dressing dry.  You may remove bandage in 3 days.  You may place Band-Aids over top of the incisions.  May shower once dressing is removed in 3 days.  Remove sling carefully only for showers, leaving arm down by your side while in the shower.  +++ Make sure to take some pain medication this evening before you fall asleep, in preparation for the nerve block wearing off in the middle of the night.  If the the pain medication causes itching, or is too strong, try taking a single tablet at a time, or combining with Benadryl.  You may be most comfortable sleeping in a recliner.  If you do sleep in near bed, placed pillows behind the shoulder that have the operation to support it.      Interscalene Nerve Block with Exparel  1.  For your surgery you have received an Interscalene Nerve Block with Exparel. 2. Nerve Blocks affect many types of nerves, including nerves that control movement, pain and normal sensation.  You may experience feelings such as numbness, tingling, heaviness, weakness or the inability to move your arm or the feeling or sensation that your arm has "fallen asleep". 3. A nerve block with Exparel can last up to 5 days.  Usually the weakness wears off first.  The tingling and heaviness usually wear off next.  Finally you may start to notice pain.  Keep in mind that this may occur in any order.  Once a nerve block starts to wear off it is usually completely gone within 60 minutes. 4. ISNB may cause mild shortness of breath, a hoarse voice, blurry vision, unequal pupils, or drooping of the face on the same side as the nerve block.  These symptoms will usually resolve with the numbness.  Very rarely the procedure itself can cause mild seizures. 5. If needed, your surgeon will give you a  prescription for pain medication.  It will take about 60 minutes for the oral pain medication to become fully effective.  So, it is recommended that you start taking this medication before the nerve block first begins to wear off, or when you first begin to feel discomfort. 6. Take your pain medication only as prescribed.  Pain medication can cause sedation and decrease your breathing if you take more than you need for the level of pain that you have. 7. Nausea is a common side effect of many pain medications.  You may want to eat something before taking your pain medicine to prevent nausea. 8. After an Interscalene nerve block, you cannot feel pain, pressure or extremes in temperature in the effected arm.  Because your arm is numb it is at an increased risk for injury.  To decrease the possibility of injury, please practice the following:  a. While you are awake change the position of your arm frequently to prevent too much pressure on any one area for prolonged periods of time. b.  If you have a cast or tight dressing, check the color or your fingers every couple of hours.  Call your surgeon with the appearance of any discoloration (white or blue). c. If you are given a sling to wear before you go home, please wear it  at all times until the block   has completely worn off.  Do not get up at night without your sling. d. Please contact [facility] or your surgeon if you do not begin to regain sensation after 7 days from the surgery.  Anesthesia may be contacted by calling the Same Day Surgery Department, Mon. through Fri., 6 am to 4 pm at [phone].   e. If you experience any other problems or concerns, please contact your surgeon's office. f. If you experience severe or prolonged shortness of breath go to the nearest emergency department.  AMBULATORY SURGERY  DISCHARGE INSTRUCTIONS  1) The drugs that you were given will stay in your system until tomorrow so for the next 24 hours you should  not: A) Drive an automobile B) Make any legal decisions C) Drink any alcoholic beverage  2) You may resume regular meals tomorrow.  Today it is better to start with liquids and gradually work up to solid foods. You may eat anything you prefer, but it is better to start with liquids, then soup and crackers, and gradually work up to solid foods.  3) Please notify your doctor immediately if you have any unusual bleeding, trouble breathing, redness and pain at the surgery site, drainage, fever, or pain not relieved by medication.  4) Additional Instructions:  Please contact your physician with any problems or Same Day Surgery at [phone], Monday through Friday 6 am to 4 pm, or [place] [facility] [place] [facility] number at [phone].  

## 2019-01-16 ENCOUNTER — Encounter: Payer: Self-pay | Admitting: Orthopedic Surgery

## 2019-01-17 NOTE — Anesthesia Postprocedure Evaluation (Signed)
Anesthesia Post Note  Patient: Lucas Griffin  Procedure(s) Performed: SHOULDER ARTHROSCOPY WITH ROTATOR CUFF REPAIR (Right Shoulder)  Patient location during evaluation: PACU Anesthesia Type: General Level of consciousness: awake and alert and oriented Pain management: pain level controlled Vital Signs Assessment: post-procedure vital signs reviewed and stable Respiratory status: spontaneous breathing Cardiovascular status: blood pressure returned to baseline Anesthetic complications: no     Last Vitals:  Vitals:   01/15/19 1704 01/15/19 1719  BP: 132/77 (!) 148/73  Pulse: 70 78  Resp:  18  Temp:  (!) 36.4 C  SpO2: 95% 100%    Last Pain:  Vitals:   01/16/19 0843  TempSrc:   PainSc: 0-No pain                 Lavanna Rog

## 2019-11-03 ENCOUNTER — Other Ambulatory Visit: Payer: Self-pay | Admitting: Orthopedic Surgery

## 2019-11-03 ENCOUNTER — Encounter: Payer: Self-pay | Admitting: Orthopedic Surgery

## 2019-11-03 NOTE — H&P (Signed)
NAME: Lucas Griffin MRN:   [id number] DOB:   05/25/1945     HISTORY AND PHYSICAL  CHIEF COMPLAINT:  Right shoulder pain  HISTORY:   Lucas Griffin a 73 y.o. male  with right  Shoulder Pain Patient complains of right shoulder pain. The symptoms began several months ago. Aggravating factors: repetitive activity. Pain is located around the acromioclavicular [facility]) joint. Discomfort is described as sharp/stabbing. Symptoms are exacerbated by repetitive movements. Evaluation to date: plain films: abnormal ac joint oa and MRI: abnormal rotator cuff tear. Therapy to date includes: rest, ice, avoidance of offending activity, OTC analgesics which are somewhat effective, prescription NSAIDS which are somewhat effective, home exercises which are somewhat effective, physical therapy which was somewhat effective and corticosteroid injection which was somewhat effective.  Plan for right shoulder arthroscopy with rotator cuff repair  PAST MEDICAL HISTORY:   Past Medical History:  Diagnosis Date  . Diabetes mellitus without complication (HCC)   . Hypertension     PAST SURGICAL HISTORY:   Past Surgical History:  Procedure Laterality Date  . CHOLECYSTECTOMY    . JOINT REPLACEMENT    . SHOULDER ARTHROSCOPY WITH ROTATOR CUFF REPAIR Right 01/15/2019   Procedure: SHOULDER ARTHROSCOPY WITH ROTATOR CUFF REPAIR;  Surgeon: Lyndle Herrlich, MD;  Location: [facility] ORS;  Service: Orthopedics;  Laterality: Right;    MEDICATIONS:  (Not in a hospital admission)   ALLERGIES:  No Known Allergies  REVIEW OF SYSTEMS:   Negative except HPI  FAMILY HISTORY:  No family history on file.  SOCIAL HISTORY:   reports that he quit smoking about 21 years ago. His smoking use included cigarettes. He has never used smokeless tobacco. He reports that he does not drink alcohol and does not use drugs.  PHYSICAL EXAM:  General appearance: alert, cooperative and no distress Neck: no JVD and supple, symmetrical, trachea  midline Resp: clear to auscultation bilaterally Cardio: regular rate and rhythm, S1, S2 normal, no murmur, click, rub or gallop GI: soft, non-tender; bowel sounds normal; no masses,  no organomegaly Extremities: extremities normal, atraumatic, no cyanosis or edema and Homans sign is negative, no sign of DVT Pulses: 2+ and symmetric Skin: Skin color, texture, turgor normal. No rashes or lesions Lymph nodes: Cervical, supraclavicular, and axillary nodes normal. Neurologic: Alert and oriented X 3, normal strength and tone. Normal symmetric reflexes. Normal coordination and gait    LABORATORY STUDIES: No results for input(s): WBC, HGB, HCT, PLT in the last 72 hours.  No results for input(s): NA, K, CL, CO2, GLUCOSE, BUN, CREATININE, CALCIUM in the last 72 hours.  STUDIES/RESULTS:  No results found.  ASSESSMENT:   Right shoulder impingement with rotator cuff tear      Active Problems:   * No active hospital problems. *    PLAN:  Right shoulder arthroscopy with rotator cuff repair  Lucas Griffin 11/03/2019. 3:25 PM

## 2019-11-04 ENCOUNTER — Other Ambulatory Visit: Payer: Self-pay | Admitting: Orthopedic Surgery

## 2019-11-04 ENCOUNTER — Encounter
Admission: RE | Admit: 2019-11-04 | Discharge: 2019-11-04 | Disposition: A | Discharge status: Home / Self Care | Payer: Medicare Other | Source: Ambulatory Visit | Attending: Orthopedic Surgery | Admitting: Orthopedic Surgery

## 2019-11-04 ENCOUNTER — Other Ambulatory Visit: Payer: Self-pay

## 2019-11-04 DIAGNOSIS — Z01818 Encounter for other preprocedural examination: Secondary | ICD-10-CM | POA: Diagnosis present

## 2019-11-04 DIAGNOSIS — Z20822 Contact with and (suspected) exposure to covid-19: Secondary | ICD-10-CM | POA: Insufficient documentation

## 2019-11-04 HISTORY — DX: Unspecified osteoarthritis, unspecified site: M19.90

## 2019-11-04 NOTE — Pre-Procedure Instructions (Signed)
Phone interview today.  Patient stated his surgery is on his left shoulder.  Office notified to correct the site.  Will be seen in the office today for H&P

## 2019-11-04 NOTE — Patient Instructions (Signed)
Your procedure is scheduled on: Mon 6/21 Report to Day Surgery. To find out your arrival time please call [phone] between 1PM - 3PM on Friday 6/18.  Remember: Instructions that are not followed completely may result in serious medical risk,  up to and including death, or upon the discretion of your surgeon and anesthesiologist your  surgery may need to be rescheduled.     _X__ 1. Do not eat food after midnight the night before your procedure.                 No gum chewing or hard candies. You may drink clear liquids up to 2 hours                 before you are scheduled to arrive for your surgery- DO not drink clear                 liquids within 2 hours of the start of your surgery.                 Clear Liquids include:  water, apple juice without pulp, clear Gatorade, G2 or                  Gatorade Zero (avoid Red/Purple/Blue), Black Coffee or Tea (Do not add                 anything to coffee or tea). _____2.   Complete the carbohydrate drink provided to you, 2 hours before arrival.  __X__2.  On the morning of surgery brush your teeth with toothpaste and water, you                may rinse your mouth with mouthwash if you wish.  Do not swallow any toothpaste of mouthwash.     _X__ 3.  No Alcohol for 24 hours before or after surgery.   _X__ 4.  Do Not Smoke or use e-cigarettes For 24 Hours Prior to Your Surgery.                 Do not use any chewable tobacco products for at least 6 hours prior to                 Surgery.  _X__  5.  Do not use any recreational drugs (marijuana, cocaine, heroin, ecstacy, MDMA or other)                For at least one week prior to your surgery.  Combination of these drugs with anesthesia                May have life threatening results.  ____  6.  Bring all medications with you on the day of surgery if instructed.   ____  7.  Notify your doctor if there is any change in your medical condition      (cold, fever,  infections).     Do not wear jewelry, make-up, hairpins, clips or nail polish. Do not wear lotions, powders, or perfumes. You may wear deodorant. Do not shave 48 hours prior to surgery. Men may shave face and neck. Do not bring valuables to the hospital.    [facility] is not responsible for any belongings or valuables.  Contacts, dentures or bridgework may not be worn into surgery. Leave your suitcase in the car. After surgery it may be brought to your room. For patients admitted to the hospital, discharge time is determined by your treatment  team.   Patients discharged the day of surgery will not be allowed to drive home.   Make arrangements for someone to be with you for the first 24 hours of your Same Day Discharge.    Please read over the following fact sheets that you were given:   See incentive spirometer instructions provided.  You will be given the device the day of surgery   __x__ Take these medicines the morning of surgery with A SIP OF WATER:    1. none  2.   3.   4.  5.  6.  ____ Fleet Enema (as directed)   __x__ Use CHG Soap (or wipes) as directed  ____ Use Benzoyl Peroxide Gel as instructed  ____ Use inhalers on the day of surgery  __x__ Stop metformin 2 days prior to surgery  Last dose on Friday 6/18    ____ Take 1/2 of usual insulin dose the night before surgery. No insulin the morning          of surgery.   _x___ Stop aspirin today   _x___ Stop Anti-inflammatories No ibuprofen, aleve or aspirin products until after the surgery   __x__ Stop supplements until after surgery.  Ascorbic Acid (VITAMIN C PO),Omega-3 Fatty Acids (FISH OIL) 1000 MG CPDR   ____ Bring C-Pap to the hospital.

## 2019-11-05 ENCOUNTER — Other Ambulatory Visit: Payer: Self-pay | Admitting: Orthopedic Surgery

## 2019-11-06 ENCOUNTER — Other Ambulatory Visit: Payer: Medicare Other

## 2019-11-06 ENCOUNTER — Other Ambulatory Visit: Payer: Self-pay

## 2019-11-06 ENCOUNTER — Encounter
Admission: RE | Admit: 2019-11-06 | Discharge: 2019-11-06 | Disposition: A | Discharge status: Home / Self Care | Payer: Medicare Other | Source: Ambulatory Visit | Attending: Orthopedic Surgery | Admitting: Orthopedic Surgery

## 2019-11-06 DIAGNOSIS — E119 Type 2 diabetes mellitus without complications: Secondary | ICD-10-CM | POA: Insufficient documentation

## 2019-11-06 DIAGNOSIS — I10 Essential (primary) hypertension: Secondary | ICD-10-CM | POA: Insufficient documentation

## 2019-11-06 DIAGNOSIS — Z01818 Encounter for other preprocedural examination: Secondary | ICD-10-CM | POA: Insufficient documentation

## 2019-11-06 DIAGNOSIS — I498 Other specified cardiac arrhythmias: Secondary | ICD-10-CM | POA: Insufficient documentation

## 2019-11-06 DIAGNOSIS — Z20822 Contact with and (suspected) exposure to covid-19: Secondary | ICD-10-CM | POA: Insufficient documentation

## 2019-11-06 LAB — COMPREHENSIVE METABOLIC PANEL
ALT: 29 U/L (ref 0–44)
AST: 24 U/L (ref 15–41)
Albumin: 3.9 g/dL (ref 3.5–5.0)
Alkaline Phosphatase: 53 U/L (ref 38–126)
Anion gap: 11 (ref 5–15)
BUN: 14 mg/dL (ref 8–23)
CO2: 23 mmol/L (ref 22–32)
Calcium: 8.7 mg/dL — ABNORMAL LOW (ref 8.9–10.3)
Chloride: 103 mmol/L (ref 98–111)
Creatinine, Ser: 0.63 mg/dL (ref 0.61–1.24)
GFR calc Af Amer: >60 mL/min (ref >60)
GFR calc non Af Amer: >60 mL/min (ref >60)
Glucose, Bld: 177 mg/dL — ABNORMAL HIGH (ref 70–99)
Potassium: 3.8 mmol/L (ref 3.5–5.1)
Sodium: 137 mmol/L (ref 135–145)
Total Bilirubin: 1.1 mg/dL (ref 0.3–1.2)
Total Protein: 7.4 g/dL (ref 6.5–8.1)

## 2019-11-06 LAB — PROTIME-INR
INR: 1 (ref 0.8–1.2)
Prothrombin Time: 12.6 seconds (ref 11.4–15.2)

## 2019-11-06 LAB — URINALYSIS, ROUTINE W REFLEX MICROSCOPIC
Bilirubin Urine: NEGATIVE
Glucose, UA: NEGATIVE mg/dL
Hgb urine dipstick: NEGATIVE
Ketones, ur: NEGATIVE mg/dL
Leukocytes,Ua: NEGATIVE
Nitrite: NEGATIVE
Protein, ur: NEGATIVE mg/dL
Specific Gravity, Urine: 1.025 (ref 1.005–1.030)
pH: 5 (ref 5.0–8.0)

## 2019-11-06 LAB — APTT: aPTT: 26 seconds (ref 24–36)

## 2019-11-06 LAB — CBC
HCT: 41.5 % (ref 39.0–52.0)
Hemoglobin: 13.8 g/dL (ref 13.0–17.0)
MCH: 29.7 pg (ref 26.0–34.0)
MCHC: 33.3 g/dL (ref 30.0–36.0)
MCV: 89.2 fL (ref 80.0–100.0)
Platelets: 279 10*3/uL (ref 150–400)
RBC: 4.65 MIL/uL (ref 4.22–5.81)
RDW: 14.3 % (ref 11.5–15.5)
WBC: 6.1 10*3/uL (ref 4.0–10.5)
nRBC: 0 % (ref 0.0–0.2)

## 2019-11-06 LAB — SARS CORONAVIRUS 2 (TAT 6-24 HRS): SARS Coronavirus 2: NEGATIVE

## 2019-11-10 ENCOUNTER — Ambulatory Visit: Payer: Medicare Other

## 2019-11-10 ENCOUNTER — Ambulatory Visit: Payer: Medicare Other | Admitting: Anesthesiology

## 2019-11-10 ENCOUNTER — Encounter: Payer: Self-pay | Admitting: Orthopedic Surgery

## 2019-11-10 ENCOUNTER — Ambulatory Visit
Admission: RE | Admit: 2019-11-10 | Discharge: 2019-11-10 | Disposition: A | Discharge status: Home / Self Care | Payer: Medicare Other | Attending: Orthopedic Surgery | Admitting: Orthopedic Surgery

## 2019-11-10 ENCOUNTER — Other Ambulatory Visit: Payer: Self-pay

## 2019-11-10 ENCOUNTER — Encounter
Admission: RE | Disposition: A | Discharge status: Home / Self Care | Payer: Self-pay | Source: Home / Self Care | Attending: Orthopedic Surgery

## 2019-11-10 DIAGNOSIS — M1990 Unspecified osteoarthritis, unspecified site: Secondary | ICD-10-CM | POA: Insufficient documentation

## 2019-11-10 DIAGNOSIS — E119 Type 2 diabetes mellitus without complications: Secondary | ICD-10-CM | POA: Insufficient documentation

## 2019-11-10 DIAGNOSIS — E7800 Pure hypercholesterolemia, unspecified: Secondary | ICD-10-CM | POA: Diagnosis not present

## 2019-11-10 DIAGNOSIS — Z79899 Other long term (current) drug therapy: Secondary | ICD-10-CM | POA: Insufficient documentation

## 2019-11-10 DIAGNOSIS — K219 Gastro-esophageal reflux disease without esophagitis: Secondary | ICD-10-CM | POA: Diagnosis not present

## 2019-11-10 DIAGNOSIS — M75121 Complete rotator cuff tear or rupture of right shoulder, not specified as traumatic: Secondary | ICD-10-CM | POA: Insufficient documentation

## 2019-11-10 DIAGNOSIS — I10 Essential (primary) hypertension: Secondary | ICD-10-CM | POA: Diagnosis not present

## 2019-11-10 DIAGNOSIS — Z87891 Personal history of nicotine dependence: Secondary | ICD-10-CM | POA: Diagnosis not present

## 2019-11-10 DIAGNOSIS — Z7982 Long term (current) use of aspirin: Secondary | ICD-10-CM | POA: Diagnosis not present

## 2019-11-10 DIAGNOSIS — E669 Obesity, unspecified: Secondary | ICD-10-CM | POA: Insufficient documentation

## 2019-11-10 DIAGNOSIS — Z6834 Body mass index (BMI) 34.0-34.9, adult: Secondary | ICD-10-CM | POA: Insufficient documentation

## 2019-11-10 DIAGNOSIS — Z7984 Long term (current) use of oral hypoglycemic drugs: Secondary | ICD-10-CM | POA: Insufficient documentation

## 2019-11-10 DIAGNOSIS — Z419 Encounter for procedure for purposes other than remedying health state, unspecified: Secondary | ICD-10-CM

## 2019-11-10 HISTORY — PX: SHOULDER ARTHROSCOPY WITH ROTATOR CUFF REPAIR: SHX5685

## 2019-11-10 LAB — GLUCOSE, CAPILLARY
Glucose-Capillary: 205 mg/dL — ABNORMAL HIGH (ref 70–99)
Glucose-Capillary: 198 mg/dL — ABNORMAL HIGH (ref 70–99)

## 2019-11-10 SURGERY — ARTHROSCOPY, SHOULDER, WITH ROTATOR CUFF REPAIR
Anesthesia: General | Site: Shoulder | Laterality: Left

## 2019-11-10 MED ORDER — ACETAMINOPHEN 10 MG/ML IV SOLN
INTRAVENOUS | Status: AC
  Filled 2019-11-10: qty 100

## 2019-11-10 MED ORDER — EPINEPHRINE PF 1 MG/ML IJ SOLN
INTRAMUSCULAR | Status: AC
  Filled 2019-11-10: qty 6

## 2019-11-10 MED ORDER — MIDAZOLAM HCL 2 MG/2ML IJ SOLN
INTRAMUSCULAR | Status: DC | PRN
  Administered 2019-11-10: 2 mg via INTRAVENOUS

## 2019-11-10 MED ORDER — CHLORHEXIDINE GLUCONATE 0.12 % MT SOLN
OROMUCOSAL | Status: AC
  Administered 2019-11-10: 15 mL via OROMUCOSAL
  Filled 2019-11-10: qty 15

## 2019-11-10 MED ORDER — FENTANYL CITRATE (PF) 100 MCG/2ML IJ SOLN: 50.0000 ug | INTRAMUSCULAR | Status: DC | PRN

## 2019-11-10 MED ORDER — HYDROCODONE-ACETAMINOPHEN 7.5-325 MG PO TABS: 1.0000 | ORAL_TABLET | ORAL | Status: DC | PRN

## 2019-11-10 MED ORDER — EPINEPHRINE (ANAPHYLAXIS) 30 MG/30ML IJ SOLN
INTRAMUSCULAR | Status: DC | PRN
  Administered 2019-11-10: 4 mL

## 2019-11-10 MED ORDER — OXYCODONE HCL 5 MG PO TABS: 5.0000 mg | ORAL_TABLET | Freq: Once | ORAL | Status: DC | PRN

## 2019-11-10 MED ORDER — HYDROCODONE-ACETAMINOPHEN 5-325 MG PO TABS: 1.0000 | ORAL_TABLET | ORAL | 0 refills | Status: AC | PRN

## 2019-11-10 MED ORDER — GLYCOPYRROLATE 0.2 MG/ML IJ SOLN
INTRAMUSCULAR | Status: AC
  Filled 2019-11-10: qty 1

## 2019-11-10 MED ORDER — ROCURONIUM BROMIDE 100 MG/10ML IV SOLN
INTRAVENOUS | Status: DC | PRN
  Administered 2019-11-10: 50 mg via INTRAVENOUS
  Administered 2019-11-10 (×2): 10 mg via INTRAVENOUS

## 2019-11-10 MED ORDER — FAMOTIDINE 20 MG PO TABS
ORAL_TABLET | ORAL | Status: AC
  Administered 2019-11-10: 20 mg via ORAL
  Filled 2019-11-10: qty 1

## 2019-11-10 MED ORDER — FENTANYL CITRATE (PF) 100 MCG/2ML IJ SOLN
INTRAMUSCULAR | Status: DC | PRN
  Administered 2019-11-10 (×2): 50 ug via INTRAVENOUS

## 2019-11-10 MED ORDER — SODIUM CHLORIDE 0.9 % IV SOLN
INTRAVENOUS | Status: DC | PRN
  Administered 2019-11-10: 15 ug/min via INTRAVENOUS

## 2019-11-10 MED ORDER — ACETAMINOPHEN 500 MG PO TABS: 500.0000 mg | ORAL_TABLET | Freq: Four times a day (QID) | ORAL | Status: DC

## 2019-11-10 MED ORDER — SUGAMMADEX SODIUM 500 MG/5ML IV SOLN
INTRAVENOUS | Status: DC | PRN
  Administered 2019-11-10: 240 mg via INTRAVENOUS

## 2019-11-10 MED ORDER — LACTATED RINGERS IV SOLN: INTRAVENOUS | Status: DC

## 2019-11-10 MED ORDER — BUPIVACAINE HCL (PF) 0.5 % IJ SOLN
INTRAMUSCULAR | Status: AC
  Filled 2019-11-10: qty 20

## 2019-11-10 MED ORDER — FENTANYL CITRATE (PF) 100 MCG/2ML IJ SOLN: 25.0000 ug | INTRAMUSCULAR | Status: DC | PRN

## 2019-11-10 MED ORDER — ACETAMINOPHEN 325 MG PO TABS: 325.0000 mg | ORAL_TABLET | Freq: Four times a day (QID) | ORAL | Status: DC | PRN

## 2019-11-10 MED ORDER — MIDAZOLAM HCL 2 MG/2ML IJ SOLN
INTRAMUSCULAR | Status: AC
  Filled 2019-11-10: qty 2

## 2019-11-10 MED ORDER — ONDANSETRON HCL 4 MG PO TABS: 4.0000 mg | ORAL_TABLET | Freq: Four times a day (QID) | ORAL | Status: DC | PRN

## 2019-11-10 MED ORDER — LACTATED RINGERS IV SOLN
INTRAVENOUS | Status: DC | PRN
Start: 2019-11-10 — End: 2019-11-10

## 2019-11-10 MED ORDER — ONDANSETRON HCL 4 MG/2ML IJ SOLN
INTRAMUSCULAR | Status: DC | PRN
  Administered 2019-11-10: 4 mg via INTRAVENOUS

## 2019-11-10 MED ORDER — SUGAMMADEX SODIUM 500 MG/5ML IV SOLN
INTRAVENOUS | Status: AC
  Filled 2019-11-10: qty 5

## 2019-11-10 MED ORDER — OXYCODONE HCL 5 MG/5ML PO SOLN: 5.0000 mg | Freq: Once | ORAL | Status: DC | PRN

## 2019-11-10 MED ORDER — METOCLOPRAMIDE HCL 10 MG PO TABS: 5.0000 mg | ORAL_TABLET | Freq: Three times a day (TID) | ORAL | Status: DC | PRN

## 2019-11-10 MED ORDER — DEXMEDETOMIDINE HCL IN NACL 200 MCG/50ML IV SOLN
INTRAVENOUS | Status: DC | PRN
Start: 2019-11-10 — End: 2019-11-10
  Administered 2019-11-10: 8 ug via INTRAVENOUS

## 2019-11-10 MED ORDER — ORAL CARE MOUTH RINSE: 15.0000 mL | Freq: Once | OROMUCOSAL | Status: AC

## 2019-11-10 MED ORDER — PHENYLEPHRINE HCL (PRESSORS) 10 MG/ML IV SOLN
INTRAVENOUS | Status: DC | PRN
  Administered 2019-11-10 (×6): 100 ug via INTRAVENOUS

## 2019-11-10 MED ORDER — ACETAMINOPHEN 10 MG/ML IV SOLN: 1000.0000 mg | Freq: Once | INTRAVENOUS | Status: DC | PRN

## 2019-11-10 MED ORDER — SODIUM CHLORIDE 0.9 % IV SOLN: INTRAVENOUS | Status: DC

## 2019-11-10 MED ORDER — ACETAMINOPHEN 10 MG/ML IV SOLN
INTRAVENOUS | Status: DC | PRN
  Administered 2019-11-10: 1000 mg via INTRAVENOUS

## 2019-11-10 MED ORDER — MIDAZOLAM HCL 2 MG/2ML IJ SOLN
INTRAMUSCULAR | Status: AC
  Administered 2019-11-10: 1 mg via INTRAVENOUS
  Filled 2019-11-10: qty 2

## 2019-11-10 MED ORDER — METOCLOPRAMIDE HCL 5 MG/ML IJ SOLN: 5.0000 mg | Freq: Three times a day (TID) | INTRAMUSCULAR | Status: DC | PRN

## 2019-11-10 MED ORDER — BUPIVACAINE HCL (PF) 0.5 % IJ SOLN
INTRAMUSCULAR | Status: DC | PRN
Start: 2019-11-10 — End: 2019-11-10
  Administered 2019-11-10: 12 mL

## 2019-11-10 MED ORDER — CEFAZOLIN SODIUM-DEXTROSE 2-4 GM/100ML-% IV SOLN
2.0000 g | INTRAVENOUS | Status: AC
  Administered 2019-11-10: 2 g via INTRAVENOUS

## 2019-11-10 MED ORDER — HYDROCODONE-ACETAMINOPHEN 5-325 MG PO TABS: 1.0000 | ORAL_TABLET | ORAL | Status: DC | PRN

## 2019-11-10 MED ORDER — MIDAZOLAM HCL 2 MG/2ML IJ SOLN: 1.0000 mg | INTRAMUSCULAR | Status: DC | PRN

## 2019-11-10 MED ORDER — SUCCINYLCHOLINE CHLORIDE 20 MG/ML IJ SOLN
INTRAMUSCULAR | Status: DC | PRN
  Administered 2019-11-10: 120 mg via INTRAVENOUS

## 2019-11-10 MED ORDER — LIDOCAINE HCL (CARDIAC) PF 100 MG/5ML IV SOSY
PREFILLED_SYRINGE | INTRAVENOUS | Status: DC | PRN
  Administered 2019-11-10: 100 mg via INTRAVENOUS

## 2019-11-10 MED ORDER — CHLORHEXIDINE GLUCONATE 0.12 % MT SOLN: 15.0000 mL | Freq: Once | OROMUCOSAL | Status: AC

## 2019-11-10 MED ORDER — PROPOFOL 10 MG/ML IV BOLUS
INTRAVENOUS | Status: DC | PRN
  Administered 2019-11-10: 200 mg via INTRAVENOUS

## 2019-11-10 MED ORDER — GLYCOPYRROLATE 0.2 MG/ML IJ SOLN
INTRAMUSCULAR | Status: DC | PRN
  Administered 2019-11-10: .2 mg via INTRAVENOUS

## 2019-11-10 MED ORDER — ONDANSETRON HCL 4 MG/2ML IJ SOLN: 4.0000 mg | Freq: Once | INTRAMUSCULAR | Status: DC | PRN

## 2019-11-10 MED ORDER — DEXAMETHASONE SODIUM PHOSPHATE 10 MG/ML IJ SOLN
INTRAMUSCULAR | Status: DC | PRN
  Administered 2019-11-10: 10 mg via INTRAVENOUS

## 2019-11-10 MED ORDER — FAMOTIDINE 20 MG PO TABS: 20.0000 mg | ORAL_TABLET | Freq: Once | ORAL | Status: AC

## 2019-11-10 MED ORDER — ONDANSETRON HCL 4 MG/2ML IJ SOLN: 4.0000 mg | Freq: Four times a day (QID) | INTRAMUSCULAR | Status: DC | PRN

## 2019-11-10 MED ORDER — DEXAMETHASONE SODIUM PHOSPHATE 10 MG/ML IJ SOLN
INTRAMUSCULAR | Status: AC
  Filled 2019-11-10: qty 1

## 2019-11-10 MED ORDER — MORPHINE SULFATE (PF) 2 MG/ML IV SOLN: 0.5000 mg | INTRAVENOUS | Status: DC | PRN

## 2019-11-10 MED ORDER — BUPIVACAINE LIPOSOME 1.3 % IJ SUSP
INTRAMUSCULAR | Status: DC | PRN
  Administered 2019-11-10: 10 mL

## 2019-11-10 MED ORDER — LIDOCAINE HCL (PF) 2 % IJ SOLN
INTRAMUSCULAR | Status: AC
  Filled 2019-11-10: qty 5

## 2019-11-10 MED ORDER — FENTANYL CITRATE (PF) 100 MCG/2ML IJ SOLN
INTRAMUSCULAR | Status: AC
  Administered 2019-11-10: 50 ug via INTRAVENOUS
  Filled 2019-11-10: qty 2

## 2019-11-10 MED ORDER — KETOROLAC TROMETHAMINE 15 MG/ML IJ SOLN: 15.0000 mg | Freq: Four times a day (QID) | INTRAMUSCULAR | Status: DC

## 2019-11-10 MED ORDER — ONDANSETRON HCL 4 MG/2ML IJ SOLN
INTRAMUSCULAR | Status: AC
  Filled 2019-11-10: qty 4

## 2019-11-10 MED ORDER — BUPIVACAINE LIPOSOME 1.3 % IJ SUSP
INTRAMUSCULAR | Status: AC
  Filled 2019-11-10: qty 20

## 2019-11-10 MED ORDER — CEFAZOLIN SODIUM-DEXTROSE 2-4 GM/100ML-% IV SOLN
INTRAVENOUS | Status: AC
  Filled 2019-11-10: qty 100

## 2019-11-10 MED ORDER — ROCURONIUM BROMIDE 10 MG/ML (PF) SYRINGE
PREFILLED_SYRINGE | INTRAVENOUS | Status: AC
  Filled 2019-11-10: qty 10

## 2019-11-10 MED ORDER — EPHEDRINE SULFATE 50 MG/ML IJ SOLN
INTRAMUSCULAR | Status: DC | PRN
  Administered 2019-11-10: 5 mg via INTRAVENOUS

## 2019-11-10 MED ORDER — FENTANYL CITRATE (PF) 100 MCG/2ML IJ SOLN
INTRAMUSCULAR | Status: AC
  Filled 2019-11-10: qty 2

## 2019-11-10 SURGICAL SUPPLY — 70 items
ADAPTER IRRIG TUBE 2 SPIKE SOL (ADAPTER) ×6 IMPLANT
ANCHOR SUT CROSSFT 4.75 (Anchor) ×6 IMPLANT
ANCHOR YKNOT PRO RC BLUE TAPE (Anchor) ×6 IMPLANT
ANCHOR YKNOT PRO RC HI-FI TAPE (Anchor) ×3 IMPLANT
BLADE FULL RADIUS 3.5 (BLADE) ×3 IMPLANT
BLADE INCISOR PLUS 4.5 (BLADE) ×3 IMPLANT
BLADE SURG MINI STRL (BLADE) ×3 IMPLANT
BRUSH SCRUB EZ  4% CHG (MISCELLANEOUS) ×2
BRUSH SCRUB EZ 4% CHG (MISCELLANEOUS) ×1 IMPLANT
BUR ACROMIONIZER 4.0 (BURR) ×3 IMPLANT
BUR BR 5.5 WIDE MOUTH (BURR) IMPLANT
CANNULA SHOULDER 7CM (CANNULA) ×3 IMPLANT
CANNULA TWIST IN 8.25X7CM (CANNULA) ×3 IMPLANT
CHLORAPREP W/TINT 26 (MISCELLANEOUS) ×3 IMPLANT
COOLER POLAR GLACIER W/PUMP (MISCELLANEOUS) IMPLANT
COVER WAND RF STERILE (DRAPES) ×3 IMPLANT
CRADLE LAMINECT ARM (MISCELLANEOUS) ×3 IMPLANT
DEVICE SUCT BLK HOLE OR FLOOR (MISCELLANEOUS) ×3 IMPLANT
DRAPE 3/4 80X56 (DRAPES) ×3 IMPLANT
DRAPE STERI 35X30 U-POUCH (DRAPES) ×3 IMPLANT
DRAPE U-SHAPE 47X51 STRL (DRAPES) ×3 IMPLANT
ELECT REM PT RETURN 9FT ADLT (ELECTROSURGICAL) ×3
ELECTRODE REM PT RTRN 9FT ADLT (ELECTROSURGICAL) ×1 IMPLANT
GAUZE 4X4 16PLY RFD (DISPOSABLE) IMPLANT
GAUZE SPONGE 4X4 12PLY STRL (GAUZE/BANDAGES/DRESSINGS) ×3 IMPLANT
GAUZE XEROFORM 1X8 LF (GAUZE/BANDAGES/DRESSINGS) ×3 IMPLANT
GLOVE BIOGEL PI IND STRL 8 (GLOVE) ×1 IMPLANT
GLOVE BIOGEL PI INDICATOR 8 (GLOVE) ×2
GLOVE SURG ORTHO 8.0 STRL STRW (GLOVE) ×3 IMPLANT
GOWN STRL REUS W/ TWL LRG LVL3 (GOWN DISPOSABLE) ×1 IMPLANT
GOWN STRL REUS W/ TWL XL LVL3 (GOWN DISPOSABLE) ×1 IMPLANT
GOWN STRL REUS W/TWL LRG LVL3 (GOWN DISPOSABLE) ×2
GOWN STRL REUS W/TWL XL LVL3 (GOWN DISPOSABLE) ×2
IV LACTATED RINGER IRRG 3000ML (IV SOLUTION) ×14
IV LR IRRIG 3000ML ARTHROMATIC (IV SOLUTION) ×7 IMPLANT
KIT STABILIZATION SHOULDER (MISCELLANEOUS) ×3 IMPLANT
KIT TURNOVER KIT A (KITS) ×3 IMPLANT
MANIFOLD NEPTUNE II (INSTRUMENTS) ×3 IMPLANT
MASK FACE SPIDER DISP (MASK) ×3 IMPLANT
MAT ABSORB  FLUID 56X50 GRAY (MISCELLANEOUS) ×2
MAT ABSORB FLUID 56X50 GRAY (MISCELLANEOUS) ×1 IMPLANT
NDL SAFETY ECLIPSE 18X1.5 (NEEDLE) ×1 IMPLANT
NEEDLE HYPO 18GX1.5 SHARP (NEEDLE) ×2
NEEDLE HYPO 22GX1.5 SAFETY (NEEDLE) ×3 IMPLANT
NEEDLE SCORPION MULTI FIRE (NEEDLE) ×3 IMPLANT
NEEDLE SPNL 18GX3.5 QUINCKE PK (NEEDLE) ×3 IMPLANT
PACK SHDR ARTHRO (MISCELLANEOUS) ×3 IMPLANT
PAD ABD DERMACEA PRESS 5X9 (GAUZE/BANDAGES/DRESSINGS) IMPLANT
PAD WRAPON POLAR SHDR XLG (MISCELLANEOUS) IMPLANT
SHEATH SHORT HANDLE 4.0 (SHEATH) ×3 IMPLANT
SLING ARM LRG DEEP (SOFTGOODS) IMPLANT
SLING ULTRA II LG (MISCELLANEOUS) ×3 IMPLANT
STRAP SAFETY 5IN WIDE (MISCELLANEOUS) ×3 IMPLANT
SUT ETHILON 4-0 (SUTURE) ×2
SUT ETHILON 4-0 FS2 18XMFL BLK (SUTURE) ×1
SUT ETHILON NAB PS2 4-0 18IN (SUTURE) ×3 IMPLANT
SUT FIBERWIRE #2 38 T-5 BLUE (SUTURE)
SUT PDS AB 0 CT1 27 (SUTURE) IMPLANT
SUT PROLENE 0 CT 2 (SUTURE) IMPLANT
SUT TIGER TAPE 7 IN WHITE (SUTURE) IMPLANT
SUTURE ETHLN 4-0 FS2 18XMF BLK (SUTURE) ×1 IMPLANT
SUTURE FIBERWR #2 38 T-5 BLUE (SUTURE) IMPLANT
SYR 10ML LL (SYRINGE) ×3 IMPLANT
SYR 50ML LL SCALE MARK (SYRINGE) ×3 IMPLANT
TAPE MICROFOAM 4IN (TAPE) ×3 IMPLANT
TUBING ARTHRO INFLOW-ONLY STRL (TUBING) ×3 IMPLANT
TUBING CONNECTING 10 (TUBING) ×2 IMPLANT
TUBING CONNECTING 10' (TUBING) ×1
WAND WEREWOLF FLOW 90D (MISCELLANEOUS) ×3 IMPLANT
WRAPON POLAR PAD SHDR XLG (MISCELLANEOUS)

## 2019-11-10 NOTE — Op Note (Addendum)
11/10/2019  12:01 PM  PATIENT:  Lucas Griffin  74 y.o. 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    The risks benefits and alternatives were discussed with the patient preoperatively including but not limited to the risks of infection, bleeding, nerve injury, persistent pain or weakness, failure of the hardware, re-tear of the rotator cuff and the need for further surgery. Medical risks include DVT and pulmonary embolism, myocardial infarction, stroke, pneumonia, respiratory failure and death. Patient understood these risks and wished to proceed.  OPERATIVE IMPLANTS: Conmed Suture Bridge with 2 medial Y-Knot anchors and 2 lateral crossFT anchors  OPERATIVE PROCEDURE: The patient was met in the preoperative area. The left shoulder was signed with my initials according the hospital's correct site of surgery protocol. The patient is brought to the OR and underwent a supraclavicular block and general endotracheal intubation by the anesthesia service.  The patient was placed in a beachchair position.  A spider arm positioner was used for this case. Examination under anesthesia revealed full passive ROM and a negative sulcus sign. There was anterior/posterior instability.  The patient was prepped and draped in a sterile fashion. A timeout was performed to verify the patient's name, date of birth, medical record  number, correct site of surgery and correct procedure to be performed there was also used to verify the patient received antibiotics that all appropriate instruments, implants and radiographs studies were available in the room. Once all in attendance were in agreement case began.  Bony landmarks were drawn out with a surgical marker along with proposed arthroscopy incisions. These were pre-injected with 0.25% marcaine with epi. An 11 blade was used to establish a posterior portal through which the arthroscope was placed in the glenohumeral joint. A full diagnostic examination of the shoulder was performed.  The anterior portal was established under direct visualization with an 18-gauge spinal needle.  A 5.75 mm arthroscopic cannula was placed through the anterior portal.   The intra-articular portion of the biceps tendon was found to have a partial tear involving greater than 50% of the diameter. Therefore the decision was made to perform a tenotomy. An arthroscopic wand was used to release the biceps tendon off the superior labrum. The arthroscopic shaver was then used to debride the frayed edges of the labrum. There were no anterior or superior labral tears seen.  Subscapularis tendon was intact. Patient had a full-thickness tear involving the supraspinatus with retraction. There were no loose bodies within the inferior recess and no evidence of HAGL lesion.  The arthroscope was then placed in the subacromial space. A lateral portal was then established using an 18-gauge spinal needle for localization.   The greater tuberosity was debrided using a 5.5 mm resector shaver blade to remove all remaining foreign fibers of the rotator cuff.  Debridement was performed until punctate bleeding was seen at the greater tuberosity footprint, which will allow for rotator cuff healing.  Extensive bursitis was encountered  and debrided using a 4-0 resector shaver blade and a 90 ArthroCare wand from the lateral portal.  Using the a double row suture bridge system medial anchors with fiber tape were placed. The cuff was mobilized and the tape passed through the rotator cuff. The tape was then crossed in usual fashion and fixated on the lateral side with two SwiveLock anchors. The final construct was stable and moved as a unit with excellent coverage of the humeral head.  An acromioplasty (subacromial decompression) was performed using a 4.0 mm burr from the lateral portal. The 4.0 mm burr was then placed through the anterior portal.  A distal clavicle excision was performed. Subacromial space was then copiously irrigated to remove all osseous debris. Final arthroscopic images were taken. Arthroscopic images were then removed.  All incisions were copiously irrigated. Skin closure for the arthroscopic incisions was performed with 3-0 nylon.  A dry sterile dressing including Steri-Strips was applied .  The patient was placed in an abduction sling.  All sharp and instrument counts were correct at the conclusion of the case. I was scrubbed and present for the entire case. I spoke with the patient's family in the post-op consultation room and informed them that the case had been performed without complication and the patient was stable in recovery room.   Kurtis Bushman, MD

## 2019-11-10 NOTE — H&P (Signed)
The patient has been re-examined, and the chart reviewed, and there have been no interval changes to the documented history and physical.  Plan a left shoulder scope today.  Anesthesia is consulted regarding a peripheral nerve block for post-operative pain.  The risks, benefits, and alternatives have been discussed at length, and the patient is willing to proceed.    

## 2019-11-10 NOTE — Discharge Instructions (Addendum)
Wear sling at all times, including sleep.  You will need to use the sling for a total of 4 weeks following surgery.  Do not try and lift your arm up or away from your body for any reason.   Keep the dressing dry.  You may remove bandage in 3 days.  You may place Band-Aids over top of the incisions.  May shower once dressing is removed in 3 days.  Remove sling carefully only for showers, leaving arm down by your side while in the shower.  +++ Make sure to take some pain medication this evening before you fall asleep, in preparation for the nerve block wearing off in the middle of the night.  If the the pain medication causes itching, or is too strong, try taking a single tablet at a time, or combining with Benadryl.  You may be most comfortable sleeping in a recliner.  If you do sleep in near bed, placed pillows behind the shoulder that have the operation to support it.      Interscalene Nerve Block with Exparel  1.  For your surgery you have received an Interscalene Nerve Block with Exparel. 2. Nerve Blocks affect many types of nerves, including nerves that control movement, pain and normal sensation.  You may experience feelings such as numbness, tingling, heaviness, weakness or the inability to move your arm or the feeling or sensation that your arm has "fallen asleep". 3. A nerve block with Exparel can last up to 5 days.  Usually the weakness wears off first.  The tingling and heaviness usually wear off next.  Finally you may start to notice pain.  Keep in mind that this may occur in any order.  Once a nerve block starts to wear off it is usually completely gone within 60 minutes. 4. ISNB may cause mild shortness of breath, a hoarse voice, blurry vision, unequal pupils, or drooping of the face on the same side as the nerve block.  These symptoms will usually resolve with the numbness.  Very rarely the procedure itself can cause mild seizures. 5. If needed, your surgeon will give you a  prescription for pain medication.  It will take about 60 minutes for the oral pain medication to become fully effective.  So, it is recommended that you start taking this medication before the nerve block first begins to wear off, or when you first begin to feel discomfort. 6. Take your pain medication only as prescribed.  Pain medication can cause sedation and decrease your breathing if you take more than you need for the level of pain that you have. 7. Nausea is a common side effect of many pain medications.  You may want to eat something before taking your pain medicine to prevent nausea. 8. After an Interscalene nerve block, you cannot feel pain, pressure or extremes in temperature in the effected arm.  Because your arm is numb it is at an increased risk for injury.  To decrease the possibility of injury, please practice the following:  a. While you are awake change the position of your arm frequently to prevent too much pressure on any one area for prolonged periods of time. b.  If you have a cast or tight dressing, check the color or your fingers every couple of hours.  Call your surgeon with the appearance of any discoloration (white or blue). c. If you are given a sling to wear before you go home, please wear it  at all times until the block   has completely worn off.  Do not get up at night without your sling. d. Please contact [facility] or your surgeon if you do not begin to regain sensation after 7 days from the surgery.  Anesthesia may be contacted by calling the Same Day Surgery Department, Mon. through Fri., 6 am to 4 pm at [phone].   e. If you experience any other problems or concerns, please contact your surgeon's office. f. If you experience severe or prolonged shortness of breath go to the nearest emergency department.  AMBULATORY SURGERY  DISCHARGE INSTRUCTIONS  1) The drugs that you were given will stay in your system until tomorrow so for the next 24 hours you should  not: A) Drive an automobile B) Make any legal decisions C) Drink any alcoholic beverage  2) You may resume regular meals tomorrow.  Today it is better to start with liquids and gradually work up to solid foods. You may eat anything you prefer, but it is better to start with liquids, then soup and crackers, and gradually work up to solid foods.  3) Please notify your doctor immediately if you have any unusual bleeding, trouble breathing, redness and pain at the surgery site, drainage, fever, or pain not relieved by medication.  4) Additional Instructions:  Please contact your physician with any problems or Same Day Surgery at [phone], Monday through Friday 6 am to 4 pm, or [place] at [place] [facility] number at [phone].  

## 2019-11-10 NOTE — Anesthesia Procedure Notes (Signed)
Anesthesia Regional Block: Interscalene brachial plexus block   Pre-Anesthetic Checklist: ,, timeout performed, Correct Patient, Correct Site, Correct Laterality, Correct Procedure, Correct Position, site marked, Risks and benefits discussed,  Surgical consent,  Pre-op evaluation,  At surgeon's request and post-op pain management  Laterality: Left  Prep: chloraprep       Needles:  Injection technique: Single-shot  Needle Type: Echogenic Needle     Needle Length: 4cm  Needle Gauge: 25     Additional Needles:   Narrative:  Start time: 11/10/2019 8:40 AM End time: 11/10/2019 8:43 AM Injection made incrementally with aspirations every 5 mL.  Performed by: Personally  Anesthesiologist: Corinda Gubler, MD  Additional Notes: Patient consented for risk and benefits of nerve block including but not limited to: nerve damage, failed block, bleeding and infection, hemidiaphragmatic paralysis and shortness of breath, Horner's syndrome.  Patient voiced understanding.  Functioning IV was confirmed and monitors were applied.  Sterile prep,hand hygiene and sterile gloves were used.  Minimal sedation used for procedure.   No paresthesia endorsed by patient during the procedure.  Negative aspiration and negative test dose prior to incremental administration of local anesthetic. The patient tolerated the procedure well with no immediate complications.

## 2019-11-10 NOTE — Anesthesia Preprocedure Evaluation (Signed)
Anesthesia Evaluation  Patient identified by MRN, date of birth, ID band Patient awake    Reviewed: Allergy & Precautions, NPO status , Patient's Chart, lab work & pertinent test results  History of Anesthesia Complications Negative for: history of anesthetic complications  Airway Mallampati: III  TM Distance: <3 FB Neck ROM: Full    Dental  (+) Caps, Dental Advisory Given, Teeth Intact, Partial Lower   Pulmonary neg sleep apnea, neg COPD, Patient abstained from smoking.Not current smoker, former smoker,    Pulmonary exam normal breath sounds clear to auscultation       Cardiovascular Exercise Tolerance: Good METShypertension, (-) CAD and (-) Past MI Normal cardiovascular exam(-) dysrhythmias  Rhythm:Regular Rate:Normal - Systolic murmurs    Neuro/Psych negative neurological ROS  negative psych ROS   GI/Hepatic Neg liver ROS, GERD  Controlled,  Endo/Other  diabetes  Renal/GU negative Renal ROS  negative genitourinary   Musculoskeletal  (+) Arthritis , Osteoarthritis,    Abdominal Normal abdominal exam  (+) + obese,   Peds negative pediatric ROS (+)  Hematology negative hematology ROS (+)   Anesthesia Other Findings Past Medical History: No date: Arthritis No date: Diabetes mellitus without complication (HCC) No date: Hypertension  Reproductive/Obstetrics                             Anesthesia Physical  Anesthesia Plan  ASA: II  Anesthesia Plan: General   Post-op Pain Management:  Regional for Post-op pain   Induction: Intravenous  PONV Risk Score and Plan: 3 and Ondansetron, Dexamethasone, Midazolam and Treatment may vary due to age or medical condition  Airway Management Planned: Oral ETT  Additional Equipment: None  Intra-op Plan:   Post-operative Plan: Extubation in OR  Informed Consent: I have reviewed the patients History and Physical, chart, labs and discussed the  procedure including the risks, benefits and alternatives for the proposed anesthesia with the patient or authorized representative who has indicated his/her understanding and acceptance.     Dental advisory given  Plan Discussed with: CRNA and Surgeon  Anesthesia Plan Comments: (Discussed risks of anesthesia with patient, including PONV, sore throat, lip/dental damage. Rare risks discussed as well, such as cardiorespiratory and neurological sequelae. Patient understands. Discussed r/b/a of interscalene block, including elective nature. Risks discussed: - Rare: bleeding, infection, nerve damage - shortness of breath from hemidiaphragmatic paralysis - unilateral horner's syndrome - poor/non-working blocks Patient understands and agrees. )        Anesthesia Quick Evaluation

## 2019-11-10 NOTE — Anesthesia Procedure Notes (Signed)
Procedure Name: Intubation Performed by: Fletcher-Harrison, Azalea Cedar, CRNA Pre-anesthesia Checklist: Patient identified, Emergency Drugs available, Suction available and Patient being monitored Patient Re-evaluated:Patient Re-evaluated prior to induction Oxygen Delivery Method: Circle system utilized Preoxygenation: Pre-oxygenation with 100% oxygen Induction Type: IV induction Ventilation: Mask ventilation without difficulty Laryngoscope Size: McGraph and 3 Grade View: Grade I Tube type: Oral Number of attempts: 1 Airway Equipment and Method: Stylet and Oral airway Placement Confirmation: ETT inserted through vocal cords under direct vision,  positive ETCO2,  breath sounds checked- equal and bilateral and CO2 detector Secured at: 21 cm Tube secured with: Tape Dental Injury: Teeth and Oropharynx as per pre-operative assessment        

## 2019-11-10 NOTE — Transfer of Care (Signed)
Immediate Anesthesia Transfer of Care Note  Patient: Lucas Griffin  Procedure(s) Performed: SHOULDER ARTHROSCOPY WITH ROTATOR CUFF REPAIR (Left Shoulder)  Patient Location: PACU  Anesthesia Type:General  Level of Consciousness: awake, drowsy and patient cooperative  Airway & Oxygen Therapy: Patient Spontanous Breathing and Patient connected to face mask oxygen  Post-op Assessment: Report given to RN and Post -op Vital signs reviewed and stable  Post vital signs: Reviewed and stable  Last Vitals:  Vitals Value Taken Time  BP 140/75 11/10/19 1217  Temp    Pulse 87 11/10/19 1220  Resp 17 11/10/19 1220  SpO2 91 % 11/10/19 1220  Vitals shown include unvalidated device data.  Last Pain:  Vitals:   11/10/19 1217  TempSrc:   PainSc: (P) Asleep         Complications: No complications documented.

## 2019-11-10 NOTE — Anesthesia Postprocedure Evaluation (Signed)
Anesthesia Post Note  Patient: ADEMOLA VERT  Procedure(s) Performed: SHOULDER ARTHROSCOPY WITH ROTATOR CUFF REPAIR (Left Shoulder)  Patient location during evaluation: PACU Anesthesia Type: General Level of consciousness: awake and alert Pain management: pain level controlled Vital Signs Assessment: post-procedure vital signs reviewed and stable Respiratory status: spontaneous breathing, nonlabored ventilation, respiratory function stable and patient connected to nasal cannula oxygen Cardiovascular status: blood pressure returned to baseline and stable Postop Assessment: no apparent nausea or vomiting Anesthetic complications: no   No complications documented.   Last Vitals:  Vitals:   11/10/19 1255 11/10/19 1307  BP: 125/73 129/65  Pulse: 90 95  Resp: 18 16  Temp: (!) 36.2 C (!) 35.7 C  SpO2: 95% 96%    Last Pain:  Vitals:   11/10/19 1307  TempSrc: Temporal  PainSc: 0-No pain                 Corinda Gubler

## 2019-11-10 NOTE — OR Nursing (Signed)
Per Dr. Odis Luster, secure chat, ok for pt to resume aspirin.

## 2019-11-11 ENCOUNTER — Encounter: Payer: Self-pay | Admitting: Orthopedic Surgery

## 2021-06-26 ENCOUNTER — Emergency Department
Admission: EM | Admit: 2021-06-26 | Discharge: 2021-06-26 | Disposition: A | Discharge status: Home / Self Care | Payer: Medicare Other | Attending: Emergency Medicine | Admitting: Emergency Medicine

## 2021-06-26 ENCOUNTER — Emergency Department: Payer: Medicare Other

## 2021-06-26 ENCOUNTER — Other Ambulatory Visit: Payer: Self-pay

## 2021-06-26 DIAGNOSIS — I10 Essential (primary) hypertension: Secondary | ICD-10-CM | POA: Insufficient documentation

## 2021-06-26 DIAGNOSIS — R1030 Lower abdominal pain, unspecified: Secondary | ICD-10-CM | POA: Diagnosis present

## 2021-06-26 DIAGNOSIS — N202 Calculus of kidney with calculus of ureter: Secondary | ICD-10-CM | POA: Insufficient documentation

## 2021-06-26 DIAGNOSIS — N200 Calculus of kidney: Secondary | ICD-10-CM

## 2021-06-26 DIAGNOSIS — E119 Type 2 diabetes mellitus without complications: Secondary | ICD-10-CM | POA: Diagnosis not present

## 2021-06-26 DIAGNOSIS — N201 Calculus of ureter: Secondary | ICD-10-CM

## 2021-06-26 DIAGNOSIS — I700 Atherosclerosis of aorta: Secondary | ICD-10-CM | POA: Insufficient documentation

## 2021-06-26 LAB — URINALYSIS, ROUTINE W REFLEX MICROSCOPIC
Bilirubin Urine: NEGATIVE
Glucose, UA: NEGATIVE mg/dL
Leukocytes,Ua: NEGATIVE
Nitrite: NEGATIVE
RBC / HPF: >50 RBC/hpf — ABNORMAL HIGH (ref 0–5)
Specific Gravity, Urine: 1.025 (ref 1.005–1.030)
pH: 6 (ref 5.0–8.0)

## 2021-06-26 LAB — CBC
HCT: 41.3 % (ref 39.0–52.0)
Hemoglobin: 13.7 g/dL (ref 13.0–17.0)
MCH: 30.6 pg (ref 26.0–34.0)
MCHC: 33.2 g/dL (ref 30.0–36.0)
MCV: 92.4 fL (ref 80.0–100.0)
Platelets: 268 10*3/uL (ref 150–400)
RBC: 4.47 MIL/uL (ref 4.22–5.81)
RDW: 14.4 % (ref 11.5–15.5)
WBC: 7.1 10*3/uL (ref 4.0–10.5)
nRBC: 0 % (ref 0.0–0.2)

## 2021-06-26 LAB — COMPREHENSIVE METABOLIC PANEL
ALT: 24 U/L (ref 0–44)
AST: 30 U/L (ref 15–41)
Albumin: 3.7 g/dL (ref 3.5–5.0)
Alkaline Phosphatase: 50 U/L (ref 38–126)
Anion gap: 9 (ref 5–15)
BUN: 16 mg/dL (ref 8–23)
CO2: 25 mmol/L (ref 22–32)
Calcium: 9.3 mg/dL (ref 8.9–10.3)
Chloride: 103 mmol/L (ref 98–111)
Creatinine, Ser: 0.86 mg/dL (ref 0.61–1.24)
GFR, Estimated: >60 mL/min (ref >60)
Glucose, Bld: 253 mg/dL — ABNORMAL HIGH (ref 70–99)
Potassium: 4.1 mmol/L (ref 3.5–5.1)
Sodium: 137 mmol/L (ref 135–145)
Total Bilirubin: 0.6 mg/dL (ref 0.3–1.2)
Total Protein: 7 g/dL (ref 6.5–8.1)

## 2021-06-26 LAB — LIPASE, BLOOD: Lipase: 77 U/L — ABNORMAL HIGH (ref 11–51)

## 2021-06-26 MED ORDER — TAMSULOSIN HCL 0.4 MG PO CAPS
0.4000 mg | ORAL_CAPSULE | Freq: Once | ORAL | Status: AC
  Administered 2021-06-26: 0.4 mg via ORAL
  Filled 2021-06-26: qty 1

## 2021-06-26 MED ORDER — LACTATED RINGERS IV BOLUS
1000.0000 mL | Freq: Once | INTRAVENOUS | Status: AC
  Administered 2021-06-26: 1000 mL via INTRAVENOUS

## 2021-06-26 MED ORDER — ONDANSETRON 4 MG PO TBDP
4.0000 mg | ORAL_TABLET | Freq: Three times a day (TID) | ORAL | 0 refills | Status: AC | PRN
Start: 2021-06-26 — End: ?

## 2021-06-26 MED ORDER — IOHEXOL 300 MG/ML  SOLN
100.0000 mL | Freq: Once | INTRAMUSCULAR | Status: AC | PRN
  Administered 2021-06-26: 100 mL via INTRAVENOUS

## 2021-06-26 MED ORDER — TAMSULOSIN HCL 0.4 MG PO CAPS: 0.4000 mg | ORAL_CAPSULE | Freq: Every day | ORAL | 1 refills | Status: AC

## 2021-06-26 NOTE — ED Provider Notes (Signed)
[facility] Provider Note    Event Date/Time   First MD Initiated Contact with Patient 06/26/21 1547     (approximate)   History   Abdominal Pain   HPI  Lucas Griffin is a 76 y.o. male who presents to the ED for evaluation of Abdominal Pain   Review outpatient GI visit from 11/9 her colonoscopy was scheduled for cancer surveillance.  History of adenomatous colonic polyps. Otherwise history of DM, HTN, HLD and obesity. He is s/p cholecystectomy and remote colonic resection with end-to-side ileocolic reanastomosis.  Patient presents to the ED, accompanied by his wife, for evaluation of 3 days of intermittent lower abdominal discomfort with associated nausea and emesis.  He reports "normal" formed stools every morning, including this morning, alongside this.  He reports intermittent sharp lower abdominal pain that can last a few hours at a time with associated nausea and occasional nonbloody nonbilious emesis. Reports his abdomen does feel a little bit bloated  Reports his urine was different color, but denies any hematuria or dysuria.  Denies penile discharge.  Denies fever, falls or syncope.  Denies shortness of breath, cough or chest pain.  Physical Exam   Triage Vital Signs: ED Triage Vitals  Enc Vitals Group     BP 06/26/21 1534 (!) 151/75     Pulse Rate 06/26/21 1534 88     Resp 06/26/21 1534 20     Temp 06/26/21 1534 97.9 F (36.6 C)     Temp Source 06/26/21 1534 Oral     SpO2 06/26/21 1534 97 %     Weight 06/26/21 1533 234 lb (106.1 kg)     Height 06/26/21 1533 6' (1.829 m)     Head Circumference --      Peak Flow --      Pain Score 06/26/21 1533 8     Pain Loc --      Pain Edu? --      Excl. in [place]? --     Most recent vital signs: Vitals:   06/26/21 1534 06/26/21 1804  BP: (!) 151/75 (!) 148/64  Pulse: 88 86  Resp: 20 19  Temp: 97.9 F (36.6 C)   SpO2: 97% 100%    General: Awake, no distress.  Pleasant and conversational  full sentences.  Obese CV:  Good peripheral perfusion. RRR Resp:  Normal effort.  Abd:  No significant distention.  No tenderness to palpation, benign and soft throughout. MSK:  No deformity noted.  Neuro:  No focal deficits appreciated. Other:     ED Results / Procedures / Treatments   Labs (all labs ordered are listed, but only abnormal results are displayed) Labs Reviewed  LIPASE, BLOOD - Abnormal; Notable for the following components:      Result Value   Lipase 77 (*)    All other components within normal limits  COMPREHENSIVE METABOLIC PANEL - Abnormal; Notable for the following components:   Glucose, Bld 253 (*)    All other components within normal limits  URINALYSIS, ROUTINE W REFLEX MICROSCOPIC - Abnormal; Notable for the following components:   Hgb urine dipstick LARGE (*)    Ketones, ur TRACE (*)    Protein, ur TRACE (*)    RBC / HPF >50 (*)    Bacteria, UA RARE (*)    All other components within normal limits  CBC    EKG   RADIOLOGY CT abdomen/pelvis reviewed by me with left-sided ureteral stone  Official radiology report(s): CT ABDOMEN  PELVIS W CONTRAST  Result Date: 06/26/2021 CLINICAL DATA:  Previous ileo colic anastomosis and cholecystectomy. Left lower quadrant abdominal pain and emesis. Small-bowel obstruction versus urinary tract stone disease suspected. EXAM: CT ABDOMEN AND PELVIS WITH CONTRAST TECHNIQUE: Multidetector CT imaging of the abdomen and pelvis was performed using the standard protocol following bolus administration of intravenous contrast. RADIATION DOSE REDUCTION: This exam was performed according to the departmental dose-optimization program which includes automated exposure control, adjustment of the mA and/or kV according to patient size and/or use of iterative reconstruction technique. CONTRAST:  195mL OMNIPAQUE IOHEXOL 300 MG/ML  SOLN COMPARISON:  03/14/2014 report FINDINGS: Lower chest: Lung bases are clear. Coronary artery calcification  is noted. Hepatobiliary: Liver parenchyma is normal. Previous cholecystectomy. Pancreas: Normal Spleen: Normal Adrenals/Urinary Tract: Adrenal glands are normal. Left kidney contains a 3 mm nonobstructing stone in the midportion. There is a 3 x 5 mm stone in the mid left ureter. Right kidney shows a simple appearing 5.7 cm cyst projecting laterally from the midportion. There is a nonobstructing 7 mm stone in the lower pole. Bladder is normal. Stomach/Bowel: Stomach and small intestine are unremarkable, except for changes of ileocolic anastomosis. No small bowel obstruction. Minimal diverticulosis of the left colon without evidence of diverticulitis. Vascular/Lymphatic: Aortic atherosclerosis. No aneurysm. IVC is normal. No adenopathy. Reproductive: Normal Other: No free fluid or air. Musculoskeletal: Ordinary lumbar degenerative changes are noted. IMPRESSION: 3 x 5 mm stone in the midportion of the left ureter, but with only mild fullness of the left renal collecting system. Nonobstructing 3 mm stone in the midportion of the left kidney. Nonobstructing 7 mm stone in the lower pole the right kidney. Previous ileocolic anastomosis without complicating feature. No evidence of bowel obstruction. Aortic Atherosclerosis (ICD10-I70.0). Coronary artery calcification is noted. Electronically Signed   By: Nelson Chimes M.D.   On: 06/26/2021 17:35    PROCEDURES and INTERVENTIONS:  Procedures  Medications  lactated ringers bolus 1,000 mL (0 mLs Intravenous Stopped 06/26/21 1810)  iohexol (OMNIPAQUE) 300 MG/ML solution 100 mL (100 mLs Intravenous Contrast Given 06/26/21 1717)  tamsulosin (FLOMAX) capsule 0.4 mg (0.4 mg Oral Given 06/26/21 1802)     IMPRESSION / MDM / ASSESSMENT AND PLAN / ED COURSE  I reviewed the triage vital signs and the nursing notes.  Pleasant 76 year old male presents to the ED with intermittent left-sided abdominal pain, likely due to ureterolithiasis and ultimately suitable for outpatient  management.  He looks clinically well and has a benign abdominal examination.  Blood work is benign without leukocytosis or significant metabolic derangements.  Hyperglycemia without acidosis is noted.  Marginal elevation of lipase is also noted.  His urine has microscopic hematuria and he has no evidence of acute cystitis considering his lack of dysuria.  CT obtained due to his intra-abdominal surgical history and the possibility of SBO, diverticulitis or other intra-abdominal pathology.  Demonstrates a moderate size left-sided ureteral stone.  His pain controlled and he looks clinically well and requesting outpatient management.  We will get him started on Flomax, discharged with Zofran and referral to urology.  Return precautions for the ED discussed.  Clinical Course as of 06/26/21 2339  Nancy Fetter Jun 26, 2021  1752 Reassessed.  Continues to have controlled pain.  We discussed CT results and ureterolithiasis.  We discussed starting Flomax and following up with urology.  We discussed considering his controlled pain, he is suitable for trial of outpatient management and he is agreeable with this.  We discussed return precautions for the  ED [DS]    Clinical Course User Index [DS] Vladimir Crofts, MD     FINAL CLINICAL IMPRESSION(S) / ED DIAGNOSES   Final diagnoses:  Ureterolithiasis  Nephrolithiasis     Rx / DC Orders   ED Discharge Orders          Ordered    tamsulosin (FLOMAX) 0.4 MG CAPS capsule  Daily        06/26/21 1749    ondansetron (ZOFRAN-ODT) 4 MG disintegrating tablet  Every 8 hours PRN        06/26/21 1750             Note:  This document was prepared using Dragon voice recognition software and may include unintentional dictation errors.   Vladimir Crofts, MD 06/26/21 [phone]

## 2021-06-26 NOTE — Discharge Instructions (Addendum)
As we discussed, please take Flomax/tamsulosin daily to help relax your ureter and hopefully passed this kidney stone.  Please follow-up with the urologist.  I included the phone number to call tomorrow morning to schedule this appointment.  If you develop any fevers, severe pain, please return to the ED.  Use Zofran as needed for nausea and vomiting.  Use Tylenol for pain and fevers.  Up to 1000 mg per dose, up to 4 times per day.  Do not take more than 4000 mg of Tylenol/acetaminophen within 24 hours.[place] Kitchen

## 2021-06-26 NOTE — ED Triage Notes (Signed)
Pt comes pov with lower central bladder/pelvic pain for a few days. Pt states that he gets the pain and it causes him to be cold and vomit. Last BM today and pt states has been regular. Denies pain with urination. States the pain comes and goes at random times. Ambulatory to room.

## 2021-06-30 ENCOUNTER — Other Ambulatory Visit: Payer: Self-pay

## 2021-06-30 ENCOUNTER — Encounter: Payer: Self-pay | Admitting: Urology

## 2021-06-30 ENCOUNTER — Ambulatory Visit: Payer: Medicare Other | Admitting: Urology

## 2021-06-30 ENCOUNTER — Ambulatory Visit
Admission: RE | Admit: 2021-06-30 | Discharge: 2021-06-30 | Disposition: A | Discharge status: Home / Self Care | Payer: Medicare Other | Attending: Urology | Admitting: Urology

## 2021-06-30 ENCOUNTER — Ambulatory Visit
Admission: RE | Admit: 2021-06-30 | Discharge: 2021-06-30 | Disposition: A | Discharge status: Home / Self Care | Payer: Medicare Other | Source: Ambulatory Visit | Attending: Urology | Admitting: Urology

## 2021-06-30 VITALS — BP 115/68 | HR 96 | Ht 72.0 in | Wt 234.0 lb

## 2021-06-30 DIAGNOSIS — N201 Calculus of ureter: Secondary | ICD-10-CM

## 2021-06-30 DIAGNOSIS — N200 Calculus of kidney: Secondary | ICD-10-CM

## 2021-06-30 LAB — URINALYSIS, COMPLETE
Bilirubin, UA: NEGATIVE
Ketones, UA: NEGATIVE
Leukocytes,UA: NEGATIVE
Nitrite, UA: NEGATIVE
Protein,UA: NEGATIVE
Specific Gravity, UA: 1.025 (ref 1.005–1.030)
Urobilinogen, Ur: 0.2 mg/dL (ref 0.2–1.0)
pH, UA: 6.5 (ref 5.0–7.5)

## 2021-06-30 LAB — MICROSCOPIC EXAMINATION: Bacteria, UA: NONE SEEN

## 2021-06-30 NOTE — Progress Notes (Signed)
06/30/2021 11:18 AM   Lucas Griffin 02-Nov-1945 [id number]  Referring provider: Dion Body, MD [facility] [place],  [place] [address]  Chief Complaint  Patient presents with   Nephrolithiasis    HPI: Lucas Griffin is a 76 y.o. male who presents for follow-up of recent ED visit for renal colic.  [facility] ED visit 06/26/2021 with a 3-day history of intermittent lower abdominal pain associated with nausea and vomiting No fever, chills or gross hematuria No prior history stone disease UA with >50 RBCs CT showed a 3 x 5 mm left mid ureteral calculus with mild hydronephrosis/hydroureter.  There were nonobstructing 3 mm left and 7 mm right renal calculi Pain was controlled with parenteral analgesics and he was discharged on tamsulosin and oral analgesics He has had no recurrent pain since his ED visit but is not aware of passing a stone No prior history of stone disease   PMH: Past Medical History:  Diagnosis Date   Arthritis    Diabetes mellitus without complication ([place])    Hypertension     Surgical History: Past Surgical History:  Procedure Laterality Date   CHOLECYSTECTOMY     JOINT REPLACEMENT Right    knee   SHOULDER ARTHROSCOPY WITH ROTATOR CUFF REPAIR Right 01/15/2019   Procedure: SHOULDER ARTHROSCOPY WITH ROTATOR CUFF REPAIR;  Surgeon: Lovell Sheehan, MD;  Location: [facility] ORS;  Service: Orthopedics;  Laterality: Right;   SHOULDER ARTHROSCOPY WITH ROTATOR CUFF REPAIR Left 11/10/2019   Procedure: SHOULDER ARTHROSCOPY WITH ROTATOR CUFF REPAIR;  Surgeon: Lovell Sheehan, MD;  Location: [facility] ORS;  Service: Orthopedics;  Laterality: Left;   SMALL INTESTINE SURGERY      Home Medications:  Allergies as of 06/30/2021   No Known Allergies      Medication List        Accurate as of June 30, 2021 11:18 AM. If you have any questions, ask your nurse or doctor.          aspirin EC 81 MG tablet Take 81 mg by mouth daily.    Fish Oil 1000 MG Cpdr Take 1,000 mg by mouth daily.   glipiZIDE 10 MG tablet Commonly known as: GLUCOTROL Take 10 mg by mouth daily.   lisinopril 10 MG tablet Commonly known as: ZESTRIL Take 20 mg by mouth 2 (two) times daily.   metFORMIN 1000 MG tablet Commonly known as: GLUCOPHAGE Take 1,000 mg by mouth 2 (two) times daily.   multivitamin with minerals Tabs tablet Take 1 tablet by mouth daily.   ondansetron 4 MG disintegrating tablet Commonly known as: ZOFRAN-ODT Take 1 tablet (4 mg total) by mouth every 8 (eight) hours as needed.   pravastatin 20 MG tablet Commonly known as: PRAVACHOL Take 20 mg by mouth at bedtime.   Semaglutide (1 MG/DOSE) 2 MG/1.5ML Sopn Inject into the skin.   tamsulosin 0.4 MG Caps capsule Commonly known as: FLOMAX Take 1 capsule (0.4 mg total) by mouth daily.   Vitamin A 2400 MCG (8000 UT) Caps Take 2,400 mcg by mouth daily.   VITAMIN C PO Take 1 tablet by mouth 3 (three) times a week.        Allergies: No Known Allergies  Family History: History reviewed. No pertinent family history.  Social History:  reports that he quit smoking about 23 years ago. His smoking use included cigarettes. He has never used smokeless tobacco. He reports that he does not drink alcohol and does not use drugs.  Physical Exam: BP 115/68    Pulse 96    Ht 6' (1.829 m)    Wt 234 lb (106.1 kg)    BMI 31.74 kg/m   Constitutional:  Alert and oriented, No acute distress. HEENT: [place] AT, moist mucus membranes.  Trachea midline, no masses. Cardiovascular: No clubbing, cyanosis, or edema. Respiratory: Normal respiratory effort, no increased work of breathing. Psychiatric: Normal mood and affect.  Laboratory Data:  Urinalysis Microscopy-3-10 RBC, calcium oxalate crystals   Pertinent Imaging: CT images were personally reviewed and interpreted  Assessment & Plan:    1.  Left ureteral calculus Presently asymptomatic, not aware of passing stone KUB  ordered and will call with results If stone is present we discussed management options We discussed various treatment options for urolithiasis including observation with or without medical expulsive therapy, shockwave lithotripsy (SWL), ureteroscopy and laser lithotripsy with stent placement, and percutaneous nephrolithotomy. We discussed that management is based on stone size, location, density, patient co-morbidities, and patient preference.  Stones <78mm in size have a >80% spontaneous passage rate. Data surrounding the use of tamsulosin for medical expulsive therapy is controversial, but meta analyses suggests it is most efficacious for distal stones between 5-56mm in size. Possible side effects include dizziness/lightheadedness, and retrograde ejaculation. SWL has a lower stone free rate in a single procedure, but also a lower complication rate compared to ureteroscopy and avoids a stent and associated stent related symptoms. Possible complications include renal hematoma, steinstrasse, and need for additional treatment. Ureteroscopy with laser lithotripsy and stent placement has a higher stone free rate than SWL in a single procedure, however increased complication rate including possible infection, ureteral injury, bleeding, and stent related morbidity. Common stent related symptoms include dysuria, urgency/frequency, and flank pain. He will think over these options and will discuss further after he is contacted with his KUB results  2.  Nephrolithiasis He also desires to have his renal calculi treated   Abbie Sons, MD  [facility] [address] [place], [place] [address] [phone]

## 2021-07-09 ENCOUNTER — Encounter: Payer: Self-pay | Admitting: Urology

## 2021-07-09 ENCOUNTER — Telehealth: Payer: Self-pay | Admitting: Urology

## 2021-07-09 NOTE — Telephone Encounter (Signed)
Lucas Griffin was not seen on KUB but may be obscured by the pelvic bones.  Please find out if he has had any recurrent pain.  Would recommend a repeat KUB to see if Lucas Griffin has progressed lower.

## 2021-07-11 NOTE — Telephone Encounter (Signed)
Notified patient as instructed, patient pleased. Patient states he is not having any more pain or problems

## 2021-12-22 ENCOUNTER — Other Ambulatory Visit (HOSPITAL_COMMUNITY): Payer: Self-pay | Admitting: Ophthalmology

## 2021-12-22 ENCOUNTER — Other Ambulatory Visit
Admission: RE | Admit: 2021-12-22 | Discharge: 2021-12-22 | Disposition: A | Discharge status: Home / Self Care | Payer: Medicare Other | Source: Ambulatory Visit | Attending: Ophthalmology | Admitting: Ophthalmology

## 2021-12-22 ENCOUNTER — Other Ambulatory Visit: Payer: Self-pay | Admitting: Ophthalmology

## 2021-12-22 DIAGNOSIS — G453 Amaurosis fugax: Secondary | ICD-10-CM | POA: Insufficient documentation

## 2021-12-22 LAB — COMPREHENSIVE METABOLIC PANEL
ALT: 18 U/L (ref 0–44)
AST: 18 U/L (ref 15–41)
Albumin: 3.9 g/dL (ref 3.5–5.0)
Alkaline Phosphatase: 54 U/L (ref 38–126)
Anion gap: 9 (ref 5–15)
BUN: 17 mg/dL (ref 8–23)
CO2: 23 mmol/L (ref 22–32)
Calcium: 9.5 mg/dL (ref 8.9–10.3)
Chloride: 107 mmol/L (ref 98–111)
Creatinine, Ser: 0.79 mg/dL (ref 0.61–1.24)
GFR, Estimated: >60 mL/min (ref >60)
Glucose, Bld: 147 mg/dL — ABNORMAL HIGH (ref 70–99)
Potassium: 3.9 mmol/L (ref 3.5–5.1)
Sodium: 139 mmol/L (ref 135–145)
Total Bilirubin: 0.8 mg/dL (ref 0.3–1.2)
Total Protein: 7.2 g/dL (ref 6.5–8.1)

## 2021-12-22 LAB — C-REACTIVE PROTEIN: CRP: 0.8 mg/dL (ref <1.0)

## 2021-12-22 LAB — SEDIMENTATION RATE: Sed Rate: 8 mm/hr (ref 0–20)

## 2021-12-26 LAB — CBC
HCT: 43 % (ref 39.0–52.0)
Hemoglobin: 14.3 g/dL (ref 13.0–17.0)
MCH: 29.9 pg (ref 26.0–34.0)
MCHC: 33.3 g/dL (ref 30.0–36.0)
MCV: 89.8 fL (ref 80.0–100.0)
Platelets: 258 10*3/uL (ref 150–400)
RBC: 4.79 MIL/uL (ref 4.22–5.81)
RDW: 14.1 % (ref 11.5–15.5)
WBC: 6.7 10*3/uL (ref 4.0–10.5)
nRBC: 0 % (ref 0.0–0.2)

## 2021-12-27 ENCOUNTER — Ambulatory Visit
Admission: RE | Admit: 2021-12-27 | Discharge: 2021-12-27 | Disposition: A | Discharge status: Home / Self Care | Payer: Medicare Other | Source: Ambulatory Visit | Attending: Ophthalmology | Admitting: Ophthalmology

## 2021-12-27 DIAGNOSIS — G453 Amaurosis fugax: Secondary | ICD-10-CM | POA: Diagnosis present

## 2022-08-15 IMAGING — CR DG ABDOMEN 1V
1 series · 2 of 2 positions shown · non-contrast
Comparison: 06/26/2021 CT

CLINICAL DATA: History of bilateral renal calculi and mildly
obstructing left mid ureteral stone.

EXAM:
ABDOMEN - 1 VIEW

[Series 1: dg abd 1 view · 0.14mm/px · 2 of 2 slices shown]
[im 1/2]
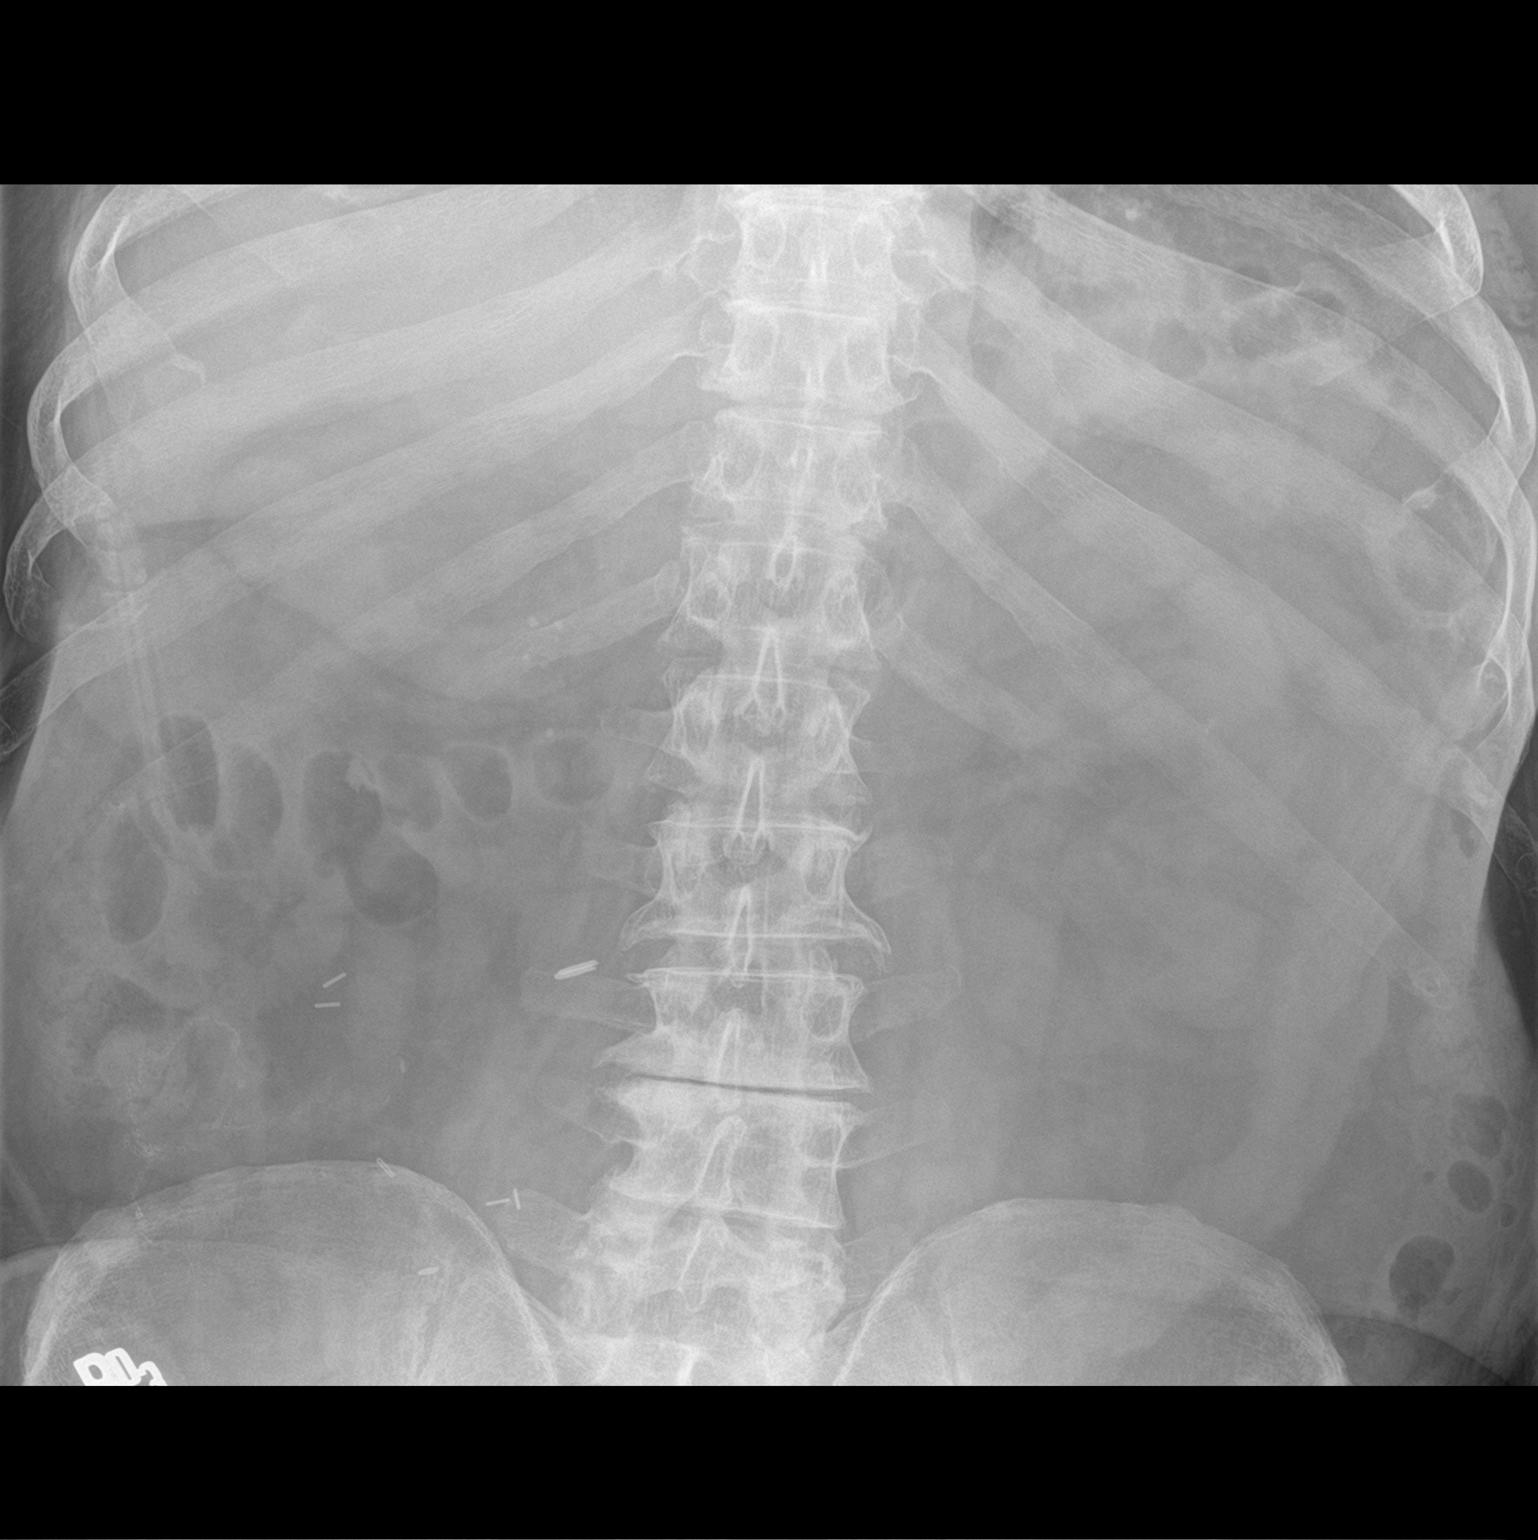
[im 2/2]
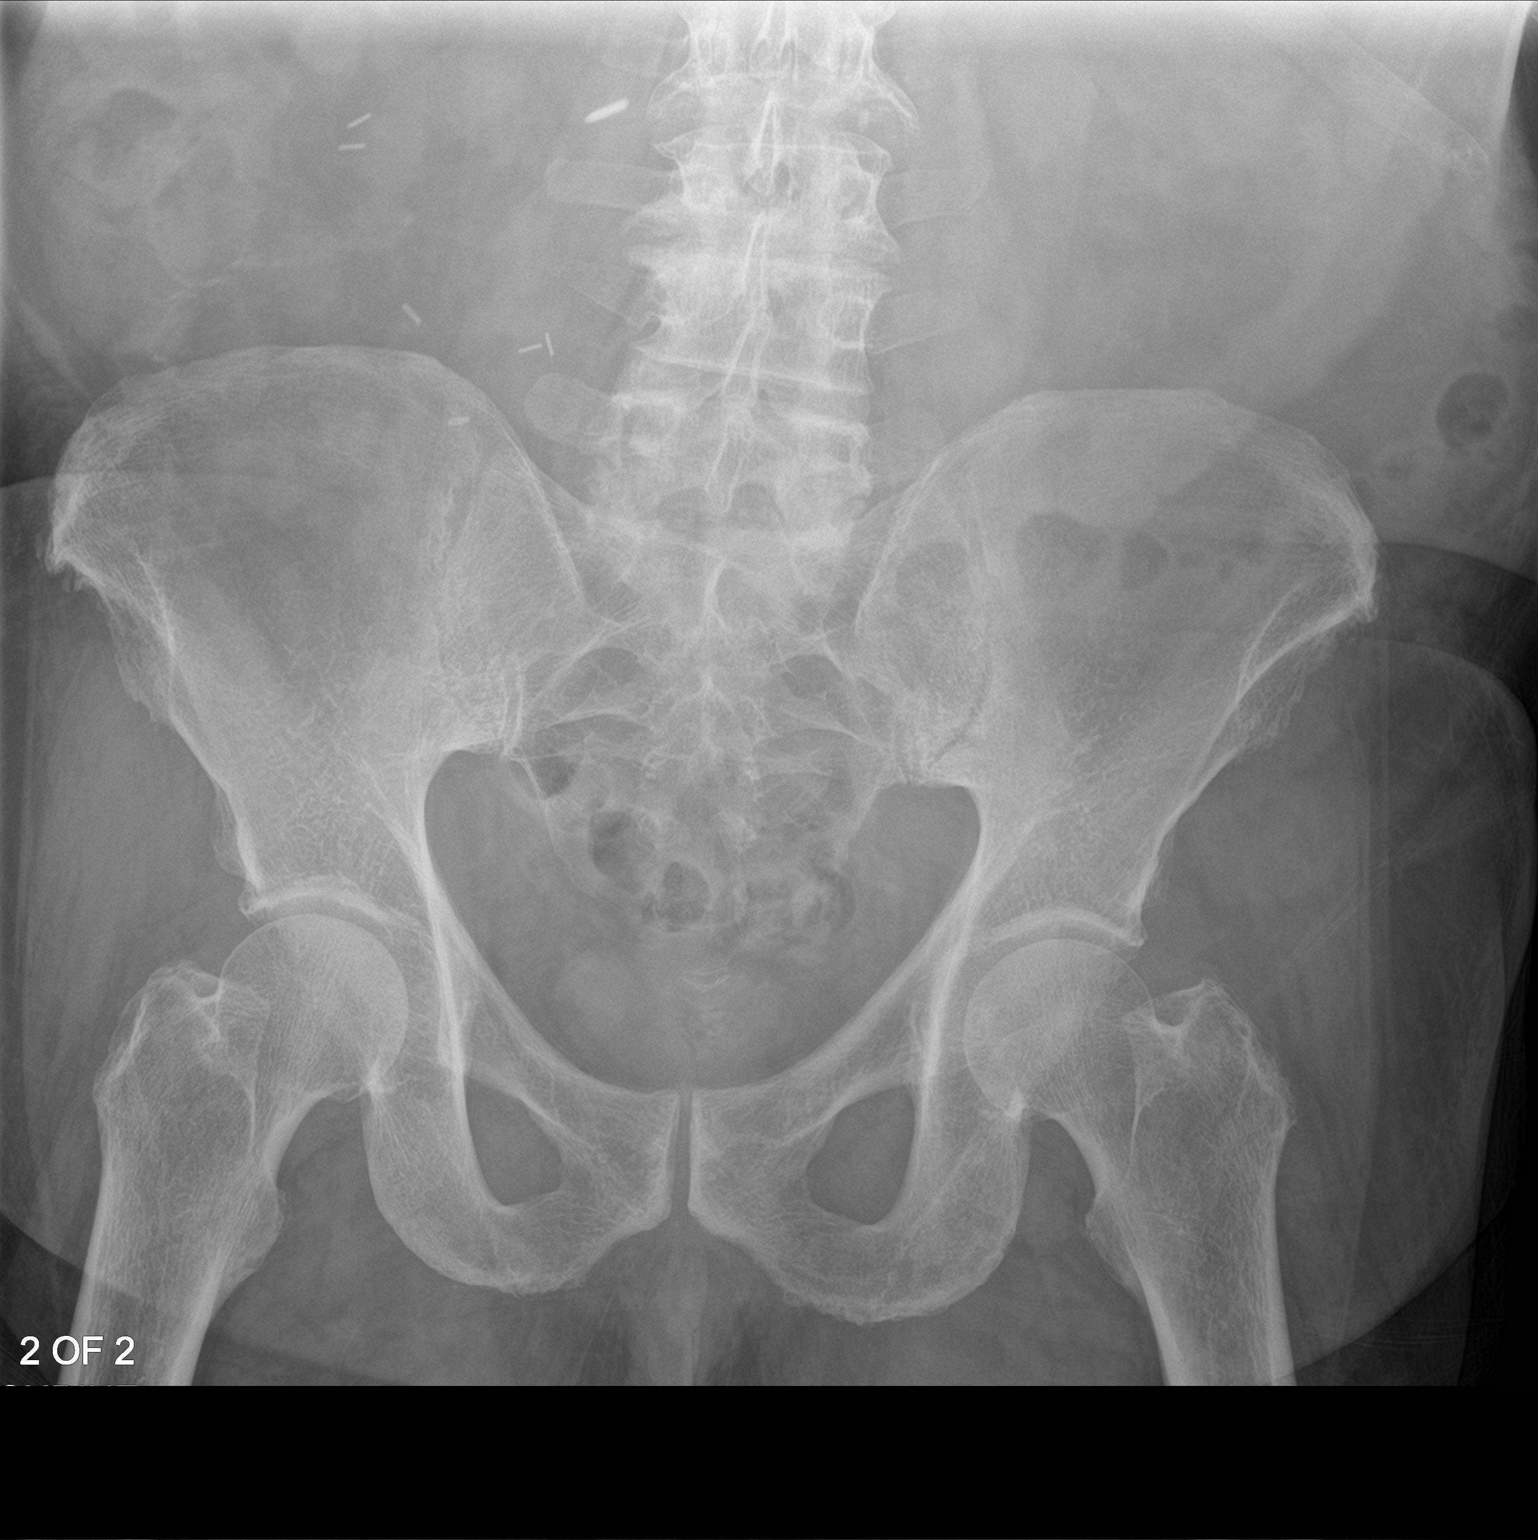

[2 of 2 positions shown; findings below may reference images not displayed]

FINDINGS: Scattered large and small bowel gas is seen. 8 mm stone is noted in
the lower pole of the right kidney similar to that seen on prior CT
examination. Faintly calcified nonobstructing stone in the
midportion of the left kidney is noted as well. The known left mid
ureteral stone is not well appreciated likely obscured by the
sacrum. Mild pancreatic calcifications are seen. Degenerative
changes of the lumbar spine are noted.
IMPRESSION: Bilateral renal calculi stable in appearance.

Known left mid ureteral stone is not well appreciated likely
obscured by the sacrum.
